# Patient Record
Sex: Female | Born: 1935 | Race: White | Hispanic: No
Health system: Southern US, Community
[De-identification: ages and names within clinical notes are randomized; demographics above are authoritative.]

---

## 1999-05-12 ENCOUNTER — Encounter: Payer: Self-pay | Admitting: Orthopedic Surgery

## 1999-05-15 ENCOUNTER — Inpatient Hospital Stay (HOSPITAL_COMMUNITY): Admission: RE | Admit: 1999-05-15 | Discharge: 1999-05-18 | Payer: Self-pay | Admitting: Orthopedic Surgery

## 1999-05-15 ENCOUNTER — Encounter: Payer: Self-pay | Admitting: Orthopedic Surgery

## 1999-05-18 ENCOUNTER — Inpatient Hospital Stay (HOSPITAL_COMMUNITY)
Admission: RE | Admit: 1999-05-18 | Discharge: 1999-05-26 | Payer: Self-pay | Admitting: Physical Medicine & Rehabilitation

## 1999-12-18 ENCOUNTER — Encounter: Admission: RE | Admit: 1999-12-18 | Discharge: 1999-12-18 | Payer: Self-pay | Admitting: Oncology

## 1999-12-18 ENCOUNTER — Encounter: Payer: Self-pay | Admitting: Oncology

## 2000-12-26 ENCOUNTER — Encounter: Admission: RE | Admit: 2000-12-26 | Discharge: 2000-12-26 | Payer: Self-pay | Admitting: Internal Medicine

## 2000-12-26 ENCOUNTER — Encounter: Payer: Self-pay | Admitting: Internal Medicine

## 2001-12-29 ENCOUNTER — Encounter: Admission: RE | Admit: 2001-12-29 | Discharge: 2001-12-29 | Payer: Self-pay | Admitting: Internal Medicine

## 2001-12-29 ENCOUNTER — Encounter: Payer: Self-pay | Admitting: Internal Medicine

## 2002-07-05 ENCOUNTER — Emergency Department (HOSPITAL_COMMUNITY): Admission: EM | Admit: 2002-07-05 | Discharge: 2002-07-05 | Payer: Self-pay | Admitting: Emergency Medicine

## 2002-07-05 ENCOUNTER — Encounter: Payer: Self-pay | Admitting: Emergency Medicine

## 2002-07-06 ENCOUNTER — Inpatient Hospital Stay (HOSPITAL_COMMUNITY): Admission: EM | Admit: 2002-07-06 | Discharge: 2002-07-09 | Payer: Self-pay | Admitting: Endocrinology

## 2002-07-06 ENCOUNTER — Encounter: Payer: Self-pay | Admitting: Endocrinology

## 2002-07-07 ENCOUNTER — Encounter (INDEPENDENT_AMBULATORY_CARE_PROVIDER_SITE_OTHER): Payer: Self-pay | Admitting: *Deleted

## 2002-07-12 ENCOUNTER — Emergency Department (HOSPITAL_COMMUNITY): Admission: EM | Admit: 2002-07-12 | Discharge: 2002-07-12 | Payer: Self-pay | Admitting: Emergency Medicine

## 2003-01-08 ENCOUNTER — Encounter: Payer: Self-pay | Admitting: Internal Medicine

## 2003-01-08 ENCOUNTER — Encounter: Admission: RE | Admit: 2003-01-08 | Discharge: 2003-01-08 | Payer: Self-pay | Admitting: Internal Medicine

## 2003-09-12 ENCOUNTER — Emergency Department (HOSPITAL_COMMUNITY): Admission: EM | Admit: 2003-09-12 | Discharge: 2003-09-12 | Payer: Self-pay | Admitting: Emergency Medicine

## 2003-11-14 ENCOUNTER — Ambulatory Visit (HOSPITAL_COMMUNITY): Admission: RE | Admit: 2003-11-14 | Discharge: 2003-11-14 | Payer: Self-pay | Admitting: Endocrinology

## 2004-02-11 ENCOUNTER — Ambulatory Visit: Payer: Self-pay | Admitting: Endocrinology

## 2004-02-18 ENCOUNTER — Ambulatory Visit (HOSPITAL_COMMUNITY): Admission: RE | Admit: 2004-02-18 | Discharge: 2004-02-18 | Payer: Self-pay | Admitting: Endocrinology

## 2004-02-21 ENCOUNTER — Ambulatory Visit: Payer: Self-pay | Admitting: Endocrinology

## 2004-04-16 ENCOUNTER — Ambulatory Visit: Payer: Self-pay | Admitting: Internal Medicine

## 2004-07-21 ENCOUNTER — Ambulatory Visit: Payer: Self-pay | Admitting: Internal Medicine

## 2004-07-27 ENCOUNTER — Ambulatory Visit: Payer: Self-pay | Admitting: Endocrinology

## 2004-08-05 ENCOUNTER — Ambulatory Visit: Payer: Self-pay

## 2004-08-21 ENCOUNTER — Ambulatory Visit: Payer: Self-pay | Admitting: Endocrinology

## 2004-09-18 ENCOUNTER — Ambulatory Visit: Payer: Self-pay | Admitting: Endocrinology

## 2004-10-14 ENCOUNTER — Ambulatory Visit: Payer: Self-pay | Admitting: Internal Medicine

## 2004-10-20 ENCOUNTER — Ambulatory Visit: Payer: Self-pay | Admitting: Endocrinology

## 2004-10-28 ENCOUNTER — Ambulatory Visit: Payer: Self-pay | Admitting: Cardiology

## 2004-11-06 ENCOUNTER — Ambulatory Visit: Payer: Self-pay | Admitting: Endocrinology

## 2004-11-11 ENCOUNTER — Ambulatory Visit (HOSPITAL_COMMUNITY): Admission: RE | Admit: 2004-11-11 | Discharge: 2004-11-11 | Payer: Self-pay | Admitting: Endocrinology

## 2005-02-04 ENCOUNTER — Ambulatory Visit: Payer: Self-pay | Admitting: Endocrinology

## 2005-04-07 ENCOUNTER — Ambulatory Visit (HOSPITAL_COMMUNITY): Admission: RE | Admit: 2005-04-07 | Discharge: 2005-04-07 | Payer: Self-pay | Admitting: Gastroenterology

## 2005-04-14 ENCOUNTER — Inpatient Hospital Stay (HOSPITAL_COMMUNITY): Admission: EM | Admit: 2005-04-14 | Discharge: 2005-04-16 | Payer: Self-pay | Admitting: Internal Medicine

## 2005-04-14 ENCOUNTER — Ambulatory Visit: Payer: Self-pay | Admitting: Internal Medicine

## 2005-04-15 ENCOUNTER — Encounter (INDEPENDENT_AMBULATORY_CARE_PROVIDER_SITE_OTHER): Payer: Self-pay | Admitting: Specialist

## 2005-04-16 ENCOUNTER — Ambulatory Visit: Payer: Self-pay | Admitting: Oncology

## 2005-04-20 ENCOUNTER — Ambulatory Visit: Payer: Self-pay | Admitting: Endocrinology

## 2005-06-10 ENCOUNTER — Ambulatory Visit: Payer: Self-pay | Admitting: Endocrinology

## 2005-06-15 ENCOUNTER — Encounter: Admission: RE | Admit: 2005-06-15 | Discharge: 2005-06-15 | Payer: Self-pay | Admitting: Gastroenterology

## 2005-10-11 ENCOUNTER — Ambulatory Visit: Payer: Self-pay | Admitting: Endocrinology

## 2005-10-27 ENCOUNTER — Ambulatory Visit: Payer: Self-pay

## 2005-11-04 ENCOUNTER — Ambulatory Visit: Payer: Self-pay | Admitting: Endocrinology

## 2005-11-11 ENCOUNTER — Ambulatory Visit: Payer: Self-pay | Admitting: Endocrinology

## 2006-05-03 ENCOUNTER — Ambulatory Visit: Payer: Self-pay | Admitting: Endocrinology

## 2006-06-01 ENCOUNTER — Ambulatory Visit: Payer: Self-pay | Admitting: Endocrinology

## 2006-06-27 ENCOUNTER — Ambulatory Visit: Payer: Self-pay | Admitting: Endocrinology

## 2006-08-01 ENCOUNTER — Ambulatory Visit: Payer: Self-pay | Admitting: Endocrinology

## 2006-08-01 LAB — CONVERTED CEMR LAB
ALT: 18 units/L (ref 0–40)
Amylase: 72 units/L (ref 27–131)
Bilirubin, Direct: 0.1 mg/dL (ref 0.0–0.3)
Calcium: 9.7 mg/dL (ref 8.4–10.5)
Eosinophils Absolute: 0 10*3/uL (ref 0.0–0.6)
Eosinophils Relative: 0.1 % (ref 0.0–5.0)
GFR calc Af Amer: 33 mL/min
GFR calc non Af Amer: 27 mL/min
Glucose, Bld: 97 mg/dL (ref 70–99)
Lymphocytes Relative: 18.7 % (ref 12.0–46.0)
MCV: 93 fL (ref 78.0–100.0)
Monocytes Relative: 11.1 % — ABNORMAL HIGH (ref 3.0–11.0)
Neutro Abs: 4.6 10*3/uL (ref 1.4–7.7)
Platelets: 364 10*3/uL (ref 150–400)
Potassium: 3.1 meq/L — ABNORMAL LOW (ref 3.5–5.1)
Sodium: 138 meq/L (ref 135–145)
WBC: 7 10*3/uL (ref 4.5–10.5)

## 2006-08-04 ENCOUNTER — Ambulatory Visit: Payer: Self-pay | Admitting: Endocrinology

## 2006-08-04 LAB — CONVERTED CEMR LAB
BUN: 42 mg/dL — ABNORMAL HIGH (ref 6–23)
Calcium: 8.6 mg/dL (ref 8.4–10.5)
Chloride: 103 meq/L (ref 96–112)
GFR calc non Af Amer: 42 mL/min

## 2006-09-28 ENCOUNTER — Ambulatory Visit: Payer: Self-pay | Admitting: Endocrinology

## 2006-09-28 LAB — CONVERTED CEMR LAB
ALT: 12 units/L (ref 0–40)
Basophils Relative: 1 % (ref 0.0–1.0)
Bilirubin, Direct: 0.1 mg/dL (ref 0.0–0.3)
CO2: 27 meq/L (ref 19–32)
Calcium, Total (PTH): 8.8 mg/dL (ref 8.4–10.5)
Creatinine, Ser: 1.2 mg/dL (ref 0.4–1.2)
Eosinophils Relative: 0.1 % (ref 0.0–5.0)
GFR calc Af Amer: 56 mL/min
Glucose, Bld: 97 mg/dL (ref 70–99)
HCT: 32.5 % — ABNORMAL LOW (ref 36.0–46.0)
Hemoglobin: 11 g/dL — ABNORMAL LOW (ref 12.0–15.0)
Lymphocytes Relative: 25.3 % (ref 12.0–46.0)
Monocytes Absolute: 0.5 10*3/uL (ref 0.2–0.7)
Neutro Abs: 2.9 10*3/uL (ref 1.4–7.7)
PTH: 84.8 pg/mL — ABNORMAL HIGH (ref 14.0–72.0)
Potassium: 4.4 meq/L (ref 3.5–5.1)
Total Bilirubin: 0.3 mg/dL (ref 0.3–1.2)
Total Protein: 6.5 g/dL (ref 6.0–8.3)
WBC: 4.5 10*3/uL (ref 4.5–10.5)

## 2006-10-13 ENCOUNTER — Encounter: Admission: RE | Admit: 2006-10-13 | Discharge: 2006-10-13 | Payer: Self-pay | Admitting: Endocrinology

## 2006-12-05 ENCOUNTER — Ambulatory Visit: Payer: Self-pay | Admitting: Endocrinology

## 2006-12-05 LAB — CONVERTED CEMR LAB
BUN: 24 mg/dL — ABNORMAL HIGH (ref 6–23)
Basophils Absolute: 0 10*3/uL (ref 0.0–0.1)
Basophils Relative: 0.3 % (ref 0.0–1.0)
CO2: 26 meq/L (ref 19–32)
Calcium: 9 mg/dL (ref 8.4–10.5)
GFR calc Af Amer: 51 mL/min
GFR calc non Af Amer: 42 mL/min
Iron: 35 ug/dL — ABNORMAL LOW (ref 42–145)
Lymphocytes Relative: 19.7 % (ref 12.0–46.0)
MCHC: 34.2 g/dL (ref 30.0–36.0)
Monocytes Absolute: 0.5 10*3/uL (ref 0.2–0.7)
Monocytes Relative: 9.8 % (ref 3.0–11.0)
Neutro Abs: 3.8 10*3/uL (ref 1.4–7.7)
Platelets: 177 10*3/uL (ref 150–400)
Potassium: 3.7 meq/L (ref 3.5–5.1)
Saturation Ratios: 11.4 % — ABNORMAL LOW (ref 20.0–50.0)
Transferrin: 219.4 mg/dL (ref >=212.0)

## 2006-12-14 ENCOUNTER — Ambulatory Visit: Payer: Self-pay | Admitting: Internal Medicine

## 2007-01-30 ENCOUNTER — Ambulatory Visit: Payer: Self-pay | Admitting: Endocrinology

## 2007-01-30 ENCOUNTER — Encounter: Payer: Self-pay | Admitting: Endocrinology

## 2007-01-30 DIAGNOSIS — I10 Essential (primary) hypertension: Secondary | ICD-10-CM | POA: Insufficient documentation

## 2007-01-30 DIAGNOSIS — K219 Gastro-esophageal reflux disease without esophagitis: Secondary | ICD-10-CM

## 2007-01-30 DIAGNOSIS — Z8679 Personal history of other diseases of the circulatory system: Secondary | ICD-10-CM | POA: Insufficient documentation

## 2007-01-30 DIAGNOSIS — Z85038 Personal history of other malignant neoplasm of large intestine: Secondary | ICD-10-CM | POA: Insufficient documentation

## 2007-01-30 DIAGNOSIS — M810 Age-related osteoporosis without current pathological fracture: Secondary | ICD-10-CM | POA: Insufficient documentation

## 2007-01-30 DIAGNOSIS — E785 Hyperlipidemia, unspecified: Secondary | ICD-10-CM | POA: Insufficient documentation

## 2007-01-30 DIAGNOSIS — H548 Legal blindness, as defined in USA: Secondary | ICD-10-CM

## 2007-05-05 ENCOUNTER — Telehealth (INDEPENDENT_AMBULATORY_CARE_PROVIDER_SITE_OTHER): Payer: Self-pay | Admitting: *Deleted

## 2007-05-15 ENCOUNTER — Ambulatory Visit: Payer: Self-pay | Admitting: Endocrinology

## 2007-05-15 DIAGNOSIS — M1990 Unspecified osteoarthritis, unspecified site: Secondary | ICD-10-CM | POA: Insufficient documentation

## 2007-05-15 DIAGNOSIS — D509 Iron deficiency anemia, unspecified: Secondary | ICD-10-CM

## 2007-05-16 LAB — CONVERTED CEMR LAB
BUN: 34 mg/dL — ABNORMAL HIGH (ref 6–23)
Basophils Relative: 1 % (ref 0.0–1.0)
CO2: 26 meq/L (ref 19–32)
Calcium: 9.3 mg/dL (ref 8.4–10.5)
Eosinophils Absolute: 0 10*3/uL (ref 0.0–0.6)
GFR calc Af Amer: 62 mL/min
GFR calc non Af Amer: 51 mL/min
Hemoglobin: 12.2 g/dL (ref 12.0–15.0)
Lymphocytes Relative: 22.7 % (ref 12.0–46.0)
MCHC: 33.3 g/dL (ref 30.0–36.0)
MCV: 97.7 fL (ref 78.0–100.0)
Monocytes Absolute: 0.5 10*3/uL (ref 0.2–0.7)
Monocytes Relative: 10.8 % (ref 3.0–11.0)
Neutro Abs: 2.9 10*3/uL (ref 1.4–7.7)
Platelets: 177 10*3/uL (ref 150–400)
Potassium: 4.1 meq/L (ref 3.5–5.1)
Saturation Ratios: 21.9 % (ref 20.0–50.0)
Transferrin: 205.9 mg/dL — ABNORMAL LOW (ref >=212.0)

## 2007-06-05 ENCOUNTER — Telehealth (INDEPENDENT_AMBULATORY_CARE_PROVIDER_SITE_OTHER): Payer: Self-pay | Admitting: *Deleted

## 2007-06-13 ENCOUNTER — Ambulatory Visit: Payer: Self-pay | Admitting: Endocrinology

## 2007-07-12 ENCOUNTER — Telehealth: Payer: Self-pay | Admitting: Endocrinology

## 2007-07-18 ENCOUNTER — Ambulatory Visit: Payer: Self-pay | Admitting: Endocrinology

## 2007-10-31 ENCOUNTER — Ambulatory Visit: Payer: Self-pay | Admitting: Endocrinology

## 2007-10-31 DIAGNOSIS — N2581 Secondary hyperparathyroidism of renal origin: Secondary | ICD-10-CM | POA: Insufficient documentation

## 2007-10-31 LAB — CONVERTED CEMR LAB
Alkaline Phosphatase: 51 units/L (ref 39–117)
Bilirubin, Direct: 0.1 mg/dL (ref 0.0–0.3)
CO2: 28 meq/L (ref 19–32)
Calcium, Total (PTH): 10.5 mg/dL (ref 8.4–10.5)
Calcium: 10.6 mg/dL — ABNORMAL HIGH (ref 8.4–10.5)
GFR calc Af Amer: 69 mL/min
HDL: 65.1 mg/dL (ref >39.0)
PTH: 12.3 pg/mL — ABNORMAL LOW (ref 14.0–72.0)
Potassium: 4.1 meq/L (ref 3.5–5.1)
Sodium: 134 meq/L — ABNORMAL LOW (ref 135–145)
Total Bilirubin: 0.7 mg/dL (ref 0.3–1.2)
Total CHOL/HDL Ratio: 1.8
VLDL: 12 mg/dL (ref 0–40)

## 2008-02-12 ENCOUNTER — Telehealth (INDEPENDENT_AMBULATORY_CARE_PROVIDER_SITE_OTHER): Payer: Self-pay | Admitting: *Deleted

## 2008-06-07 ENCOUNTER — Telehealth (INDEPENDENT_AMBULATORY_CARE_PROVIDER_SITE_OTHER): Payer: Self-pay | Admitting: *Deleted

## 2008-06-10 ENCOUNTER — Ambulatory Visit: Payer: Self-pay | Admitting: Endocrinology

## 2008-06-10 DIAGNOSIS — D61818 Other pancytopenia: Secondary | ICD-10-CM | POA: Insufficient documentation

## 2008-06-10 LAB — CONVERTED CEMR LAB
Calcium, Total (PTH): 9.2 mg/dL (ref 8.4–10.5)
PTH: 102.6 pg/mL — ABNORMAL HIGH (ref 14.0–72.0)

## 2008-06-12 LAB — CONVERTED CEMR LAB
Basophils Relative: 0 % (ref 0.0–3.0)
Eosinophils Relative: 0.2 % (ref 0.0–5.0)
HCT: 35.3 % — ABNORMAL LOW (ref 36.0–46.0)
Hemoglobin: 12.2 g/dL (ref 12.0–15.0)
Lymphocytes Relative: 23.1 % (ref 12.0–46.0)
Monocytes Relative: 11.1 % (ref 3.0–12.0)
Neutro Abs: 2.9 10*3/uL (ref 1.4–7.7)
RBC: 3.6 M/uL — ABNORMAL LOW (ref 3.87–5.11)

## 2008-06-17 ENCOUNTER — Ambulatory Visit: Payer: Self-pay | Admitting: Endocrinology

## 2008-06-18 ENCOUNTER — Ambulatory Visit: Payer: Self-pay | Admitting: Internal Medicine

## 2008-06-18 ENCOUNTER — Telehealth: Payer: Self-pay | Admitting: Family Medicine

## 2008-06-18 ENCOUNTER — Inpatient Hospital Stay (HOSPITAL_COMMUNITY): Admission: EM | Admit: 2008-06-18 | Discharge: 2008-06-24 | Payer: Self-pay | Admitting: Emergency Medicine

## 2008-06-18 ENCOUNTER — Ambulatory Visit: Payer: Self-pay | Admitting: Oncology

## 2008-06-19 ENCOUNTER — Encounter: Payer: Self-pay | Admitting: Oncology

## 2008-06-19 LAB — CONVERTED CEMR LAB
Eosinophils Relative: 0.3 % (ref 0.0–5.0)
Hemoglobin: 12.2 g/dL (ref 12.0–15.0)
Lymphocytes Relative: 21.3 % (ref 12.0–46.0)
Monocytes Relative: 11.2 % (ref 3.0–12.0)
Neutro Abs: 3 10*3/uL (ref 1.4–7.7)
RBC: 3.62 M/uL — ABNORMAL LOW (ref 3.87–5.11)
RDW: 12.9 % (ref 11.5–14.6)
WBC: 4.5 10*3/uL (ref 4.5–10.5)

## 2008-06-21 ENCOUNTER — Ambulatory Visit: Payer: Self-pay | Admitting: Oncology

## 2008-07-02 ENCOUNTER — Ambulatory Visit (HOSPITAL_COMMUNITY): Admission: RE | Admit: 2008-07-02 | Discharge: 2008-07-02 | Payer: Self-pay | Admitting: Oncology

## 2008-07-02 ENCOUNTER — Encounter: Payer: Self-pay | Admitting: Endocrinology

## 2008-07-02 LAB — CBC WITH DIFFERENTIAL/PLATELET
Basophils Absolute: 0 10*3/uL (ref 0.0–0.1)
EOS%: 0.2 % (ref 0.0–7.0)
HGB: 12.9 g/dL (ref 11.6–15.9)
MCH: 33.3 pg (ref 25.1–34.0)
MCHC: 34.5 g/dL (ref 31.5–36.0)
MCV: 96.5 fL (ref 79.5–101.0)
MONO%: 8.4 % (ref 0.0–14.0)
NEUT%: 78 % — ABNORMAL HIGH (ref 38.4–76.8)
RDW: 13 % (ref 11.2–14.5)

## 2008-07-02 LAB — COMPREHENSIVE METABOLIC PANEL
AST: 19 U/L (ref 0–37)
Alkaline Phosphatase: 58 U/L (ref 39–117)
BUN: 30 mg/dL — ABNORMAL HIGH (ref 6–23)
Creatinine, Ser: 1.17 mg/dL (ref 0.40–1.20)

## 2008-08-02 ENCOUNTER — Ambulatory Visit: Payer: Self-pay | Admitting: Oncology

## 2008-08-06 ENCOUNTER — Encounter: Payer: Self-pay | Admitting: Endocrinology

## 2008-08-06 ENCOUNTER — Encounter: Payer: Self-pay | Admitting: Internal Medicine

## 2008-08-06 LAB — CBC WITH DIFFERENTIAL/PLATELET
BASO%: 0 % (ref 0.0–2.0)
Basophils Absolute: 0 10*3/uL (ref 0.0–0.1)
HCT: 40 % (ref 34.8–46.6)
HGB: 14.1 g/dL (ref 11.6–15.9)
LYMPH%: 8.3 % — ABNORMAL LOW (ref 14.0–49.7)
MCH: 33.1 pg (ref 25.1–34.0)
MCHC: 35.3 g/dL (ref 31.5–36.0)
MONO#: 0.2 10*3/uL (ref 0.1–0.9)
NEUT%: 89.4 % — ABNORMAL HIGH (ref 38.4–76.8)
Platelets: 222 10*3/uL (ref 145–400)
WBC: 9.6 10*3/uL (ref 3.9–10.3)
lymph#: 0.8 10*3/uL — ABNORMAL LOW (ref 0.9–3.3)

## 2008-08-08 ENCOUNTER — Ambulatory Visit (HOSPITAL_COMMUNITY): Admission: RE | Admit: 2008-08-08 | Discharge: 2008-08-08 | Payer: Self-pay | Admitting: Oncology

## 2008-08-26 ENCOUNTER — Encounter: Payer: Self-pay | Admitting: Endocrinology

## 2008-09-10 ENCOUNTER — Encounter: Payer: Self-pay | Admitting: Endocrinology

## 2008-09-10 LAB — CBC WITH DIFFERENTIAL/PLATELET
BASO%: 0 % (ref 0.0–2.0)
Eosinophils Absolute: 0 10*3/uL (ref 0.0–0.5)
HCT: 35.1 % (ref 34.8–46.6)
LYMPH%: 4.3 % — ABNORMAL LOW (ref 14.0–49.7)
MONO#: 0.5 10*3/uL (ref 0.1–0.9)
NEUT#: 11.6 10*3/uL — ABNORMAL HIGH (ref 1.5–6.5)
NEUT%: 92 % — ABNORMAL HIGH (ref 38.4–76.8)
Platelets: 206 10*3/uL (ref 145–400)
WBC: 12.6 10*3/uL — ABNORMAL HIGH (ref 3.9–10.3)
lymph#: 0.5 10*3/uL — ABNORMAL LOW (ref 0.9–3.3)

## 2008-10-10 ENCOUNTER — Emergency Department (HOSPITAL_COMMUNITY): Admission: EM | Admit: 2008-10-10 | Discharge: 2008-10-10 | Payer: Self-pay | Admitting: Emergency Medicine

## 2008-10-12 ENCOUNTER — Encounter: Admission: RE | Admit: 2008-10-12 | Discharge: 2008-10-12 | Payer: Self-pay | Admitting: Geriatric Medicine

## 2008-12-08 ENCOUNTER — Ambulatory Visit: Payer: Self-pay | Admitting: Internal Medicine

## 2008-12-08 ENCOUNTER — Inpatient Hospital Stay (HOSPITAL_COMMUNITY): Admission: EM | Admit: 2008-12-08 | Discharge: 2008-12-15 | Payer: Self-pay | Admitting: Emergency Medicine

## 2008-12-26 ENCOUNTER — Ambulatory Visit: Payer: Self-pay | Admitting: Endocrinology

## 2008-12-26 DIAGNOSIS — R1319 Other dysphagia: Secondary | ICD-10-CM

## 2008-12-26 DIAGNOSIS — L89109 Pressure ulcer of unspecified part of back, unspecified stage: Secondary | ICD-10-CM | POA: Insufficient documentation

## 2009-01-08 ENCOUNTER — Telehealth: Payer: Self-pay | Admitting: Endocrinology

## 2009-01-16 ENCOUNTER — Telehealth: Payer: Self-pay | Admitting: Endocrinology

## 2009-01-21 ENCOUNTER — Telehealth: Payer: Self-pay | Admitting: Endocrinology

## 2009-01-24 ENCOUNTER — Telehealth: Payer: Self-pay | Admitting: Endocrinology

## 2009-01-27 ENCOUNTER — Encounter: Payer: Self-pay | Admitting: Endocrinology

## 2009-02-03 ENCOUNTER — Telehealth (INDEPENDENT_AMBULATORY_CARE_PROVIDER_SITE_OTHER): Payer: Self-pay | Admitting: *Deleted

## 2009-02-04 ENCOUNTER — Telehealth: Payer: Self-pay | Admitting: Endocrinology

## 2009-02-10 ENCOUNTER — Telehealth (INDEPENDENT_AMBULATORY_CARE_PROVIDER_SITE_OTHER): Payer: Self-pay | Admitting: *Deleted

## 2009-02-10 ENCOUNTER — Telehealth: Payer: Self-pay | Admitting: Endocrinology

## 2009-02-12 ENCOUNTER — Telehealth: Payer: Self-pay | Admitting: Endocrinology

## 2009-02-12 ENCOUNTER — Ambulatory Visit: Payer: Self-pay | Admitting: Endocrinology

## 2009-02-13 ENCOUNTER — Ambulatory Visit: Payer: Self-pay | Admitting: Endocrinology

## 2009-02-13 ENCOUNTER — Telehealth (INDEPENDENT_AMBULATORY_CARE_PROVIDER_SITE_OTHER): Payer: Self-pay | Admitting: *Deleted

## 2009-02-13 DIAGNOSIS — E039 Hypothyroidism, unspecified: Secondary | ICD-10-CM | POA: Insufficient documentation

## 2009-02-13 DIAGNOSIS — F0390 Unspecified dementia without behavioral disturbance: Secondary | ICD-10-CM

## 2009-02-13 DIAGNOSIS — E876 Hypokalemia: Secondary | ICD-10-CM

## 2009-02-13 DIAGNOSIS — K439 Ventral hernia without obstruction or gangrene: Secondary | ICD-10-CM | POA: Insufficient documentation

## 2009-02-13 DIAGNOSIS — N390 Urinary tract infection, site not specified: Secondary | ICD-10-CM

## 2009-02-13 LAB — CONVERTED CEMR LAB
Basophils Absolute: 0 10*3/uL (ref 0.0–0.1)
Bilirubin Urine: NEGATIVE
Calcium: 9.8 mg/dL (ref 8.4–10.5)
Chloride: 98 meq/L (ref 96–112)
Creatinine, Ser: 0.9 mg/dL (ref 0.4–1.2)
Eosinophils Absolute: 0 10*3/uL (ref 0.0–0.7)
Iron: 42 ug/dL (ref 42–145)
Lymphs Abs: 2.9 10*3/uL (ref 0.7–4.0)
MCHC: 34.4 g/dL (ref 30.0–36.0)
Monocytes Absolute: 0.7 10*3/uL (ref 0.1–1.0)
Monocytes Relative: 7.8 % (ref 3.0–12.0)
Neutro Abs: 5.7 10*3/uL (ref 1.4–7.7)
Nitrite: NEGATIVE
Platelets: 264 10*3/uL (ref 150.0–400.0)
RBC: 4.43 M/uL (ref 3.87–5.11)
RDW: 15.1 % — ABNORMAL HIGH (ref 11.5–14.6)
TSH: 7.97 microintl units/mL — ABNORMAL HIGH (ref 0.35–5.50)
Total Protein, Urine: 30 mg/dL
WBC: 9.3 10*3/uL (ref 4.5–10.5)
pH: 5.5 (ref 5.0–8.0)

## 2009-02-17 ENCOUNTER — Telehealth: Payer: Self-pay | Admitting: Endocrinology

## 2009-02-17 ENCOUNTER — Telehealth (INDEPENDENT_AMBULATORY_CARE_PROVIDER_SITE_OTHER): Payer: Self-pay | Admitting: *Deleted

## 2009-02-28 ENCOUNTER — Ambulatory Visit: Payer: Self-pay | Admitting: Endocrinology

## 2009-03-03 ENCOUNTER — Telehealth (INDEPENDENT_AMBULATORY_CARE_PROVIDER_SITE_OTHER): Payer: Self-pay | Admitting: *Deleted

## 2009-03-17 ENCOUNTER — Telehealth (INDEPENDENT_AMBULATORY_CARE_PROVIDER_SITE_OTHER): Payer: Self-pay | Admitting: *Deleted

## 2009-03-19 ENCOUNTER — Encounter: Payer: Self-pay | Admitting: Endocrinology

## 2009-04-09 ENCOUNTER — Telehealth (INDEPENDENT_AMBULATORY_CARE_PROVIDER_SITE_OTHER): Payer: Self-pay | Admitting: *Deleted

## 2009-04-09 ENCOUNTER — Ambulatory Visit: Payer: Self-pay | Admitting: Endocrinology

## 2009-04-14 ENCOUNTER — Telehealth: Payer: Self-pay | Admitting: Endocrinology

## 2009-04-16 ENCOUNTER — Telehealth: Payer: Self-pay | Admitting: Endocrinology

## 2009-04-29 ENCOUNTER — Telehealth: Payer: Self-pay | Admitting: Internal Medicine

## 2009-05-06 ENCOUNTER — Telehealth: Payer: Self-pay | Admitting: Endocrinology

## 2009-05-10 ENCOUNTER — Inpatient Hospital Stay (HOSPITAL_COMMUNITY): Admission: EM | Admit: 2009-05-10 | Discharge: 2009-05-12 | Payer: Self-pay | Admitting: Emergency Medicine

## 2009-05-15 ENCOUNTER — Telehealth: Payer: Self-pay | Admitting: Endocrinology

## 2009-05-15 ENCOUNTER — Encounter: Payer: Self-pay | Admitting: Endocrinology

## 2009-05-19 ENCOUNTER — Encounter: Payer: Self-pay | Admitting: Endocrinology

## 2009-05-21 ENCOUNTER — Telehealth: Payer: Self-pay | Admitting: Endocrinology

## 2009-05-21 ENCOUNTER — Telehealth (INDEPENDENT_AMBULATORY_CARE_PROVIDER_SITE_OTHER): Payer: Self-pay | Admitting: *Deleted

## 2009-05-25 ENCOUNTER — Encounter: Payer: Self-pay | Admitting: Endocrinology

## 2009-05-26 ENCOUNTER — Telehealth: Payer: Self-pay | Admitting: Endocrinology

## 2009-06-09 ENCOUNTER — Ambulatory Visit: Payer: Self-pay | Admitting: Endocrinology

## 2009-06-20 ENCOUNTER — Telehealth (INDEPENDENT_AMBULATORY_CARE_PROVIDER_SITE_OTHER): Payer: Self-pay | Admitting: *Deleted

## 2009-07-29 ENCOUNTER — Encounter: Payer: Self-pay | Admitting: Internal Medicine

## 2009-07-30 ENCOUNTER — Telehealth: Payer: Self-pay | Admitting: Internal Medicine

## 2009-07-30 ENCOUNTER — Encounter: Payer: Self-pay | Admitting: Internal Medicine

## 2009-08-04 ENCOUNTER — Ambulatory Visit: Payer: Self-pay | Admitting: Endocrinology

## 2009-08-06 ENCOUNTER — Telehealth: Payer: Self-pay | Admitting: Endocrinology

## 2009-08-06 ENCOUNTER — Encounter: Payer: Self-pay | Admitting: Internal Medicine

## 2009-08-10 ENCOUNTER — Encounter: Payer: Self-pay | Admitting: Endocrinology

## 2009-08-25 ENCOUNTER — Telehealth (INDEPENDENT_AMBULATORY_CARE_PROVIDER_SITE_OTHER): Payer: Self-pay | Admitting: *Deleted

## 2009-09-10 DEATH — deceased

## 2010-05-12 NOTE — Letter (Signed)
Summary: CMN/HCS  CMN/HCS   Imported By: Lester Boise 08/13/2009 09:16:11  _____________________________________________________________________  External Attachment:    Type:   Image     Comment:   External Document

## 2010-05-12 NOTE — Miscellaneous (Signed)
Summary: Care Plan/[facility]  Care Plan/[facility]   Imported By: Lester LaPorte 04/24/2009 10:39:08  _____________________________________________________________________  External Attachment:    Type:   Image     Comment:   External Document

## 2010-05-12 NOTE — Progress Notes (Signed)
Summary: ABX  Phone Note Call from Patient   Caller: Daughter [name] [phone] Summary of Call: pt's daughter called stating that pt was on multiple ABX at the time of reaction--lethargy, delusions and confusion. Daughter is not certain that this ABX was the cause but states that she will be more confortable with pt taking a different ABX Daughter says that she will check PT again and I verified MRN number with her. Initial call taken by: Margaret Pyle, CMA,  May 26, 2009 3:58 PM  Follow-up for Phone Call        thanks, we have added cipro to the allergy list. Follow-up by: Minus Breeding MD,  May 26, 2009 4:12 PM   New Allergies: ! CIPROFLOXACIN HCL (CIPROFLOXACIN HCL) New Allergies: ! CIPROFLOXACIN HCL (CIPROFLOXACIN HCL)

## 2010-05-12 NOTE — Progress Notes (Signed)
  Phone Note Call from Patient   Summary of Call: Spoke with pts daughter Cordelia Pen @ [phone]) she stated MD wanted her to return in 2-3 weeks and she said this is not possible. She said the only way to transport her mom is by ambulance. She wanted me to pass this message for an FYI. Initial call taken by: Josph Macho CMA,  May 21, 2009 9:20 AM  Follow-up for Phone Call        noted, thank you.  i am unable to find a dr to come to your home periodically, so plan to come here, by ambulance if necessary, at least 2x a year Follow-up by: Minus Breeding MD,  May 24, 2009 3:45 PM  Additional Follow-up for Phone Call Additional follow up Details #1::        Patient daughter notified and is very upset that no one notified her that her mother still had an infection and that the antibiotic sent in is one she is allergic to. Please advise of new antibiotic.  *Updated allergy list. Additional Follow-up by: Lucious Groves,  May 26, 2009 9:14 AM   New Allergies: ! CIPROFLOXACIN HCL (CIPROFLOXACIN HCL) Additional Follow-up for Phone Call Additional follow up Details #2::    a urine test was taken on 05/19/09, and hh nurse should have notified you of that.  assuming that was done, a message was left for you on phone tree.  cipro was not listed as an allergy either here in the office, or at the hospital.  in the hospital, pt was given a drug very similar to cipro, and no mention of any probs with this drug is on the computer hospital summary.  please tell us when and what was the "allergy" to cipro. Follow-up by: Minus Breeding MD,  May 26, 2009 12:22 PM  Additional Follow-up for Phone Call Additional follow up Details #3:: Details for Additional Follow-up Action Taken: left message on machine for pt's daughter to return my call. Margaret Pyle, CMA  May 26, 2009 1:38 PM   pt's daughter stated that allergy is listed on pt's discharge order. I did not see allergy on D/C in  EMR but daughter is sure. [name] is requesting an alternate ABX and she is also still very upset about not being notified about UTI. She says she did check PT and there was no message there. please advise. Margaret Pyle, CMA  May 26, 2009 2:45 PM   New Allergies: ! CIPROFLOXACIN HCL (CIPROFLOXACIN HCL)        Allergies: 1)  ! Norvasc (Amlodipine Besylate) 2)  ! Bactrim 3)  ! Ciprofloxacin Hcl (Ciprofloxacin Hcl) 4)  Amoxicillin (Amoxicillin)

## 2010-05-12 NOTE — Progress Notes (Signed)
Summary: REFILLS  Phone Note Call from Patient Call back at Home Phone [phone]   Caller:  [phone] [name] Summary of Call: Patient is requesting a call back regarding mail order meds.  Initial call taken by: Lamar Sprinkles, CMA,  April 14, 2009 5:46 PM  Follow-up for Phone Call        1. pt's daughter is requesting refill of all of pt medication to send to mail order co (she will pick up Rx) 2. [name] is requesting MD review pt dosage of Avalide. pt's family friend is a MD and told pt daughter that she should be concerned because this is a very high dose. Pt has been on 300-12.5 two times a day since leaving Nursing home in march ( this not on med list). pt's daughter says she brought pt's meds in last OV and this should have been updated on med list 3. pt has cough and would like to know if there is any OTC med MD can recommend. Please advise..... Follow-up by: Margaret Pyle, CMA,  April 15, 2009 9:18 AM  Additional Follow-up for Phone Call Additional follow up Details #1::        our med list says avalide 300/25, 1/day.  please take this only.  i have printed refills. if pt is verified to have no fever or sob, she can take robitussin-dm as needed cough. Additional Follow-up by: Minus Breeding MD,  April 15, 2009 9:26 AM    Additional Follow-up for Phone Call Additional follow up Details #2::    pt's daughter informed, rx in cabinet for pt pick up Follow-up by: Margaret Pyle, CMA,  April 15, 2009 9:41 AM  Prescriptions: MIRALAX  POWD (POLYETHYLENE GLYCOL 3350) 1/2 of 17 grams qd  #3 mos x 3   Entered and Authorized by:   Minus Breeding MD   Signed by:   Minus Breeding MD on 04/15/2009   Method used:   Print then Give to Patient   RxID:   [id number] PANTOPRAZOLE SODIUM 40 MG TBEC (PANTOPRAZOLE SODIUM)   #90 x 3   Entered and Authorized by:   Minus Breeding MD   Signed by:   Minus Breeding MD on 04/15/2009   Method used:    Print then Give to Patient   RxID:   [id number] POTASSIUM CHLORIDE 20 MEQ/15ML (10%) LIQD (POTASSIUM CHLORIDE) 10 meq qd  #90 x 3   Entered and Authorized by:   Minus Breeding MD   Signed by:   Minus Breeding MD on 04/15/2009   Method used:   Print then Give to Patient   RxID:   [id number] CITALOPRAM HYDROBROMIDE 10 MG TABS (CITALOPRAM HYDROBROMIDE) 1 qd  #90 x 3   Entered and Authorized by:   Minus Breeding MD   Signed by:   Minus Breeding MD on 04/15/2009   Method used:   Print then Give to Patient   RxID:   [id number] LEVOTHYROXINE SODIUM 25 MCG TABS (LEVOTHYROXINE SODIUM) 1 tab qd  #90 x 3   Entered and Authorized by:   Minus Breeding MD   Signed by:   Minus Breeding MD on 04/15/2009   Method used:   Print then Give to Patient   RxID:   [id number] FERREX 150 150 MG CAPS (POLYSACCHARIDE IRON COMPLEX) take 2 by mouth qd  #180 x 3   Entered and Authorized by:   Minus Breeding  MD   Signed by:   Minus Breeding MD on 04/15/2009   Method used:   Print then Give to Patient   RxID:   [id number] AVALIDE 300-25 MG TABS (IRBESARTAN-HYDROCHLOROTHIAZIDE) take 1 by mouth qd  #90 x 3   Entered and Authorized by:   Minus Breeding MD   Signed by:   Minus Breeding MD on 04/15/2009   Method used:   Print then Give to Patient   RxID:   [id number]

## 2010-05-12 NOTE — Progress Notes (Signed)
  Phone Note Outgoing Call   Summary of Call: spoke with pts daughter Cordelia Pen) at [phone] and she informed me pt is now with Hospice. Initial call taken by: Josph Macho RMA,  Aug 25, 2009 9:59 AM

## 2010-05-12 NOTE — Progress Notes (Signed)
  Phone Note Outgoing Call   Summary of Call: Left a message for Koleen Distance at [facility] [phone]) to contact our office concerning pt. Initial call taken by: Josph Macho CMA,  May 21, 2009 9:51 AM  Follow-up for Phone Call        Spoke with Kriste Basque at [facility] and faxed orders to [facility] at [phone]. Also sent a copy to be scanned. Follow-up by: Josph Macho CMA,  May 21, 2009 10:56 AM     Appended Document:  Mailed out completed paperwork. Sent a copy to be scanned.

## 2010-05-12 NOTE — Progress Notes (Signed)
Summary: RX needed  Phone Note Call from Patient   Caller: Daughter--Sherri Tiburcio Pea Summary of Call: Patient daughter left message on triage that patient did not get all of her prescriptions (90 day supply for [facility]). Per the message the daughter did not get Vitamin D and her pain medicine. I will inform the daughter that pain meds cannot be done in 90 day supply, but can we print prescription for the vitamin d? Please advise. Initial call taken by: Lucious Groves,  April 16, 2009 8:09 AM  Follow-up for Phone Call        for pt's safety, i need to see the prescription bottle for the vitamin-d to verify dosage.   the computer says the pain med was done sept 2010 for #100, x 6, so that would make it due in 2 more mos.  how many does pt take per day? Follow-up by: Minus Breeding MD,  April 16, 2009 8:25 AM  Additional Follow-up for Phone Call Additional follow up Details #1::        Returned call and was told to call Sherri at work # [phone]. Patient daughter notified, and she said the patient is only taking 2-3 pain meds per day. She will call back with dosage of the vitamin D. Additional Follow-up by: Lucious Groves,  April 16, 2009 9:29 AM    Additional Follow-up for Phone Call Additional follow up Details #2::    pr's daughter called back stating pt is taking Vit D2 1.25 micrograms 50,000 units Follow-up by: Margaret Pyle, CMA,  April 16, 2009 2:02 PM  Additional Follow-up for Phone Call Additional follow up Details #3:: Details for Additional Follow-up Action Taken: i sent vitamin-d to [facility]   at this many pain pills/day, the prescription will be due for refill in march.  please return here by then.  pt's daughter informed. Margaret Pyle, CMA  April 16, 2009 4:07 PM  Additional Follow-up by: Minus Breeding MD,  April 16, 2009 3:31 PM  New/Updated Medications: VITAMIN D (ERGOCALCIFEROL) 50000 UNIT CAPS (ERGOCALCIFEROL) 1q week Prescriptions: VITAMIN D  (ERGOCALCIFEROL) 50000 UNIT CAPS (ERGOCALCIFEROL) 1q week  #12 x 1   Entered and Authorized by:   Minus Breeding MD   Signed by:   Minus Breeding MD on 04/16/2009   Method used:   Electronically to        [facility]* (mail-order)             ,          Ph: [phone]       Fax: [phone]   RxID:   [id number]

## 2010-05-12 NOTE — Progress Notes (Signed)
Summary: rx refill       New/Updated Medications: PANTOPRAZOLE SODIUM 40 MG TBEC (PANTOPRAZOLE SODIUM) 1 by mouth once daily Prescriptions: PANTOPRAZOLE SODIUM 40 MG TBEC (PANTOPRAZOLE SODIUM) 1 by mouth once daily  #90 x 3   Entered by:   Margaret Pyle, CMA   Authorized by:   Corwin Levins MD   Signed by:   Margaret Pyle, CMA on 05/06/2009   Method used:   Faxed to ...       [facility]* (mail-order)             ,          Ph: [phone]       Fax: [phone]   RxID:   [id number]

## 2010-05-12 NOTE — Miscellaneous (Signed)
Summary: [facility]   Imported By: Lester Mangham 08/08/2009 10:40:30  _____________________________________________________________________  External Attachment:    Type:   Image     Comment:   External Document

## 2010-05-12 NOTE — Miscellaneous (Signed)
Summary: Plan/[facility]  Plan/[facility]   Imported By: Lester  06/11/2009 10:20:16  _____________________________________________________________________  External Attachment:    Type:   Image     Comment:   External Document

## 2010-05-12 NOTE — Progress Notes (Signed)
Summary: Avalide-Changed to Losartan  Phone Note From Pharmacy   Caller: [employer] [place] [phone] Call For: ID:  [id number]  Summary of Call: PA request--Avalide. The office rec'd letter from [facility] that patient was given a temporary supply of Avalide. The preferred alternatives are:  Diovan, Micardis, Losartan, and HCTZ. (HCT combo is also included) Please advise. Initial call taken by: Lucious Groves,  May 15, 2009 8:31 AM  Follow-up for Phone Call        med list already reflects change to losartan-hctz.  ok to refill it as needed. Follow-up by: Minus Breeding MD,  May 15, 2009 8:47 AM  Additional Follow-up for Phone Call Additional follow up Details #1::        noted. Thanks. Additional Follow-up by: Lucious Groves,  May 15, 2009 10:24 AM

## 2010-05-12 NOTE — Miscellaneous (Signed)
Summary: [facility]  [facility]   Imported By: Lester Stockton 08/06/2009 09:08:25  _____________________________________________________________________  External Attachment:    Type:   Image     Comment:   External Document

## 2010-05-12 NOTE — Progress Notes (Signed)
Summary: verbal/SAE pt  Phone Note Other Incoming   Caller: [facility] [phone] Summary of Call: HHRN called stating that pt's daughter reports mild change in pt's mental status. RN is request a as needed visit with pt check for UTI. verbal okay? Initial call taken by: Margaret Pyle, CMA,  July 30, 2009 1:39 PM  Follow-up for Phone Call        ok for verbal Follow-up by: Corwin Levins MD,  July 30, 2009 1:43 PM  Additional Follow-up for Phone Call Additional follow up Details #1::        [facility] informed Additional Follow-up by: Margaret Pyle, CMA,  July 30, 2009 1:53 PM

## 2010-05-12 NOTE — Progress Notes (Signed)
Summary: Hospice services  Phone Note Other Incoming   Caller: Vicki @ [facility] [place] [phone] Summary of Call: Chip Boer called stating that pt's daughter is requesting Hospice services and last OV note needs to be sent. I have faxed OV to Vicki's attention [phone] Initial call taken by: Margaret Pyle, CMA,  August 06, 2009 9:12 AM

## 2010-05-12 NOTE — Miscellaneous (Signed)
Summary: Plan of Care & Treatment/[facility] Care & [facility]   Imported By: Sherian Rein 08/06/2009 12:06:39  _____________________________________________________________________  External Attachment:    Type:   Image     Comment:   External Document

## 2010-05-12 NOTE — Progress Notes (Signed)
  Phone Note From Other Clinic   Summary of Call: Koleen Distance from [place] states pt daughter Cordelia Pen [phone]) called her and said the pt is coughing (nothing coming up), temp 97.3, occasionally weezing, no other symptoms. Daughter asked nurse at [facility] if she should give her mother a decongestant? Pt is bed ridden and daughter has to call ambulance to get pt to appts? Please advise? Initial call taken by: Josph Macho RMA,  June 20, 2009 3:36 PM  Follow-up for Phone Call        please advise loratadine-d as needed congestion Follow-up by: Minus Breeding MD,  June 20, 2009 3:52 PM  Additional Follow-up for Phone Call Additional follow up Details #1::        Informed pts daughter Cordelia Pen) Additional Follow-up by: Josph Macho RMA,  June 20, 2009 4:23 PM

## 2010-05-12 NOTE — Miscellaneous (Signed)
Summary: Physician Interim Order/[facility]  Physician Interim Order/[facility]   Imported By: Sherian Rein 05/28/2009 11:41:28  _____________________________________________________________________  External Attachment:    Type:   Image     Comment:   External Document

## 2010-05-12 NOTE — Progress Notes (Signed)
Summary: Alt Med/ SAE pt  Phone Note From Pharmacy   Caller: [facility] [phone] ref [id number] Summary of Call: [facility] called to inform MD that Avalide is unavailable through manufacturer. [facility] is unsure when it willbe available and is requesting an alternate. [facility] has suggested Hyzaar 50/12.5, 100/12.5 or 100/25. please advise, I will fax back to [facility] to include ref ID. Initial call taken by: Margaret Pyle, CMA,  April 29, 2009 11:24 AM  Follow-up for Phone Call        ok for the losartan/hctz 100/25  - but call pt first and ask if she wants the rx sent to [facility] now, or she can come in for it  done hardcopy to [facility]  Follow-up by: Corwin Levins MD,  April 29, 2009 12:06 PM  Additional Follow-up for Phone Call Additional follow up Details #1::        Rx sent to [facility]. pt's daughter informed Additional Follow-up by: Margaret Pyle, CMA,  April 29, 2009 1:18 PM    New/Updated Medications: LOSARTAN POTASSIUM-HCTZ 100-25 MG TABS (LOSARTAN POTASSIUM-HCTZ) 1 by mouth once daily Prescriptions: LOSARTAN POTASSIUM-HCTZ 100-25 MG TABS (LOSARTAN POTASSIUM-HCTZ) 1 by mouth once daily  #90 x 3   Entered and Authorized by:   Corwin Levins MD   Signed by:   Corwin Levins MD on 04/29/2009   Method used:   Print then Give to Patient   RxID:   [id number]

## 2010-05-12 NOTE — Progress Notes (Signed)
Summary: Cipro  Phone Note Call from Patient Call back at Home Phone [phone]   Caller: Patient [phone] Summary of Call: pt's daughter called stating that pt needs Rx for Promethizine, Rx was removed from med list 02/2009 Initial call taken by: Margaret Pyle, CMA,  May 26, 2009 4:56 PM  Follow-up for Phone Call        does dtr agree to bring pt in at least 2x a year for doctor appointments? Follow-up by: Minus Breeding MD,  May 27, 2009 8:42 AM  Additional Follow-up for Phone Call Additional follow up Details #1::        pt's daughter states that she will do her best to get pt in 2x a year. pt is also asking for an alt to Cipro since MD added it to allergy list. please advise Additional Follow-up by: Margaret Pyle, CMA,  May 27, 2009 9:07 AM    Additional Follow-up for Phone Call Additional follow up Details #2::    this infection is very resisitant to antibiotics.  unless pt has fever, she should not take antibiotics. Follow-up by: Minus Breeding MD,  May 27, 2009 12:30 PM  Additional Follow-up for Phone Call Additional follow up Details #3:: Details for Additional Follow-up Action Taken: pt's daughter wants to make sure MD is aware that pt has a cath. She also questioned the reason MD rx ABX originally? she is also requesting a refill of promethazine. please advise. Margaret Pyle, CMA  May 27, 2009 2:49 PM    New/Updated Medications: PROMETHAZINE HCL 12.5 MG TABS (PROMETHAZINE HCL) 1 q4h as needed nausea Prescriptions: PROMETHAZINE HCL 12.5 MG TABS (PROMETHAZINE HCL) 1 q4h as needed nausea  #50 x 3   Entered and Authorized by:   Minus Breeding MD   Signed by:   Minus Breeding MD on 05/27/2009   Method used:   Electronically to        [facility]  [address] [id number]* (retail)       [address]       [place], [place]  [address]       Ph: [phone] or [phone]       Fax: [phone]   RxID:    [id number]  at the time of the rx, the culture result was not available.  yes, i am aware she has a catheter. [name], md  I will inform daughter, will pt be able to get refill of promethazine? Margaret Pyle, CMA  May 27, 2009 3:14 PM   sent [name], md  pt's daughter informed. Margaret Pyle, CMA  May 28, 2009 8:47 AM

## 2010-05-12 NOTE — Medication Information (Signed)
Summary: Losartan/[employer] [facility] [address]  Losartan/[employer] [place] [facility] [place]   Imported By: Lester [place] 05/16/2009 07:59:59  _____________________________________________________________________  External Attachment:    Type:   Image     Comment:   External Document

## 2010-06-28 LAB — CBC
HCT: 32.4 % — ABNORMAL LOW (ref 36.0–46.0)
HCT: 43.9 % (ref 36.0–46.0)
Hemoglobin: 10.7 g/dL — ABNORMAL LOW (ref 12.0–15.0)
Hemoglobin: 11 g/dL — ABNORMAL LOW (ref 12.0–15.0)
Platelets: 228 10*3/uL (ref 150–400)
RBC: 3.51 MIL/uL — ABNORMAL LOW (ref 3.87–5.11)
RDW: 14.6 % (ref 11.5–15.5)
RDW: 14.8 % (ref 11.5–15.5)

## 2010-06-28 LAB — URINE MICROSCOPIC-ADD ON

## 2010-06-28 LAB — COMPREHENSIVE METABOLIC PANEL
Albumin: 4 g/dL (ref 3.5–5.2)
Alkaline Phosphatase: 89 U/L (ref 39–117)
BUN: 22 mg/dL (ref 6–23)
Calcium: 10.3 mg/dL (ref 8.4–10.5)
Potassium: 3.6 mEq/L (ref 3.5–5.1)
Total Protein: 7.9 g/dL (ref 6.0–8.3)

## 2010-06-28 LAB — DIFFERENTIAL
Basophils Absolute: 0.2 10*3/uL — ABNORMAL HIGH (ref 0.0–0.1)
Basophils Relative: 0 % (ref 0–1)
Lymphocytes Relative: 16 % (ref 12–46)
Lymphocytes Relative: 24 % (ref 12–46)
Lymphs Abs: 1 10*3/uL (ref 0.7–4.0)
Lymphs Abs: 1.6 10*3/uL (ref 0.7–4.0)
Monocytes Absolute: 0.1 10*3/uL (ref 0.1–1.0)
Monocytes Absolute: 0.6 10*3/uL (ref 0.1–1.0)
Monocytes Relative: 2 % — ABNORMAL LOW (ref 3–12)
Monocytes Relative: 9 % (ref 3–12)
Neutro Abs: 4.2 10*3/uL (ref 1.7–7.7)
Neutro Abs: 4.9 10*3/uL (ref 1.7–7.7)

## 2010-06-28 LAB — BASIC METABOLIC PANEL
CO2: 25 mEq/L (ref 19–32)
Calcium: 8.8 mg/dL (ref 8.4–10.5)
Chloride: 105 mEq/L (ref 96–112)
GFR calc Af Amer: >60 mL/min (ref >60)
GFR calc non Af Amer: >60 mL/min (ref >60)
GFR calc non Af Amer: >60 mL/min (ref >60)
Glucose, Bld: 84 mg/dL (ref 70–99)
Potassium: 4 mEq/L (ref 3.5–5.1)
Potassium: 4.3 mEq/L (ref 3.5–5.1)
Sodium: 135 mEq/L (ref 135–145)
Sodium: 136 mEq/L (ref 135–145)

## 2010-06-28 LAB — URINALYSIS, ROUTINE W REFLEX MICROSCOPIC
Bilirubin Urine: NEGATIVE
Glucose, UA: NEGATIVE mg/dL
Specific Gravity, Urine: 1.02 (ref 1.005–1.030)
pH: 5.5 (ref 5.0–8.0)

## 2010-06-28 LAB — URINE CULTURE

## 2010-07-17 LAB — BASIC METABOLIC PANEL
BUN: 6 mg/dL (ref 6–23)
GFR calc non Af Amer: >60 mL/min (ref >60)
Glucose, Bld: 81 mg/dL (ref 70–99)
Potassium: 3.2 mEq/L — ABNORMAL LOW (ref 3.5–5.1)

## 2010-07-17 LAB — CBC
HCT: 31.6 % — ABNORMAL LOW (ref 36.0–46.0)
MCV: 95.9 fL (ref 78.0–100.0)
Platelets: 204 10*3/uL (ref 150–400)
RDW: 13.8 % (ref 11.5–15.5)

## 2010-07-18 LAB — CULTURE, BLOOD (ROUTINE X 2)
Culture: NO GROWTH
Culture: NO GROWTH

## 2010-07-18 LAB — CBC
HCT: 34.7 % — ABNORMAL LOW (ref 36.0–46.0)
Hemoglobin: 11.4 g/dL — ABNORMAL LOW (ref 12.0–15.0)
MCHC: 32.8 g/dL (ref 30.0–36.0)
MCV: 96.1 fL (ref 78.0–100.0)
Platelets: 208 10*3/uL (ref 150–400)
RBC: 3.61 MIL/uL — ABNORMAL LOW (ref 3.87–5.11)
WBC: 5.4 10*3/uL (ref 4.0–10.5)
WBC: 7.5 10*3/uL (ref 4.0–10.5)

## 2010-07-18 LAB — BASIC METABOLIC PANEL
CO2: 21 mEq/L (ref 19–32)
Chloride: 109 mEq/L (ref 96–112)
GFR calc Af Amer: >60 mL/min (ref >60)
Potassium: 3.9 mEq/L (ref 3.5–5.1)
Sodium: 136 mEq/L (ref 135–145)

## 2010-07-18 LAB — RAPID URINE DRUG SCREEN, HOSP PERFORMED
Barbiturates: NOT DETECTED
Benzodiazepines: NOT DETECTED
Cocaine: NOT DETECTED
Opiates: POSITIVE — AB

## 2010-07-18 LAB — WOUND CULTURE

## 2010-07-18 LAB — URINALYSIS, ROUTINE W REFLEX MICROSCOPIC
Bilirubin Urine: NEGATIVE
Nitrite: NEGATIVE
Specific Gravity, Urine: 1.019 (ref 1.005–1.030)
Urobilinogen, UA: 0.2 mg/dL (ref 0.0–1.0)

## 2010-07-18 LAB — COMPREHENSIVE METABOLIC PANEL
AST: 25 U/L (ref 0–37)
Albumin: 2.8 g/dL — ABNORMAL LOW (ref 3.5–5.2)
Alkaline Phosphatase: 104 U/L (ref 39–117)
Chloride: 99 mEq/L (ref 96–112)
Creatinine, Ser: 0.94 mg/dL (ref 0.4–1.2)
GFR calc Af Amer: >60 mL/min (ref >60)
Potassium: 3 mEq/L — ABNORMAL LOW (ref 3.5–5.1)
Total Bilirubin: 0.6 mg/dL (ref 0.3–1.2)

## 2010-07-18 LAB — URINE CULTURE
Colony Count: NO GROWTH
Culture: NO GROWTH

## 2010-07-18 LAB — DIFFERENTIAL
Basophils Absolute: 0 10*3/uL (ref 0.0–0.1)
Eosinophils Relative: 0 % (ref 0–5)
Lymphocytes Relative: 11 % — ABNORMAL LOW (ref 12–46)
Monocytes Absolute: 0.2 10*3/uL (ref 0.1–1.0)
Monocytes Relative: 3 % (ref 3–12)

## 2010-07-18 LAB — GLUCOSE, CAPILLARY: Glucose-Capillary: 137 mg/dL — ABNORMAL HIGH (ref 70–99)

## 2010-07-18 LAB — CLOSTRIDIUM DIFFICILE EIA

## 2010-07-19 LAB — CBC
MCHC: 34.1 g/dL (ref 30.0–36.0)
MCV: 99.9 fL (ref 78.0–100.0)
Platelets: 276 10*3/uL (ref 150–400)
RBC: 3.62 MIL/uL — ABNORMAL LOW (ref 3.87–5.11)
RDW: 16 % — ABNORMAL HIGH (ref 11.5–15.5)

## 2010-07-19 LAB — BASIC METABOLIC PANEL
CO2: 24 mEq/L (ref 19–32)
Calcium: 8.6 mg/dL (ref 8.4–10.5)
Chloride: 99 mEq/L (ref 96–112)
Creatinine, Ser: 1.04 mg/dL (ref 0.4–1.2)
Glucose, Bld: 119 mg/dL — ABNORMAL HIGH (ref 70–99)

## 2010-07-19 LAB — DIFFERENTIAL
Basophils Absolute: 0.2 10*3/uL — ABNORMAL HIGH (ref 0.0–0.1)
Basophils Relative: 2 % — ABNORMAL HIGH (ref 0–1)
Eosinophils Absolute: 0 10*3/uL (ref 0.0–0.7)
Monocytes Relative: 3 % (ref 3–12)
Neutrophils Relative %: 89 % — ABNORMAL HIGH (ref 43–77)

## 2010-07-23 LAB — PROTIME-INR: Prothrombin Time: 14.5 seconds (ref 11.6–15.2)

## 2010-07-23 LAB — COMPREHENSIVE METABOLIC PANEL
ALT: 13 U/L (ref 0–35)
CO2: 25 mEq/L (ref 19–32)
Calcium: 9 mg/dL (ref 8.4–10.5)
Creatinine, Ser: 1.03 mg/dL (ref 0.4–1.2)
GFR calc non Af Amer: 52 mL/min — ABNORMAL LOW (ref >60)
Glucose, Bld: 100 mg/dL — ABNORMAL HIGH (ref 70–99)
Sodium: 134 mEq/L — ABNORMAL LOW (ref 135–145)

## 2010-07-23 LAB — RETICULOCYTES: Retic Ct Pct: 0.9 % (ref 0.4–3.1)

## 2010-07-23 LAB — CHROMOSOME ANALYSIS, BONE MARROW

## 2010-07-23 LAB — CBC
HCT: 31.6 % — ABNORMAL LOW (ref 36.0–46.0)
HCT: 31.9 % — ABNORMAL LOW (ref 36.0–46.0)
HCT: 33.1 % — ABNORMAL LOW (ref 36.0–46.0)
HCT: 33.7 % — ABNORMAL LOW (ref 36.0–46.0)
Hemoglobin: 10.9 g/dL — ABNORMAL LOW (ref 12.0–15.0)
Hemoglobin: 11.1 g/dL — ABNORMAL LOW (ref 12.0–15.0)
Hemoglobin: 11.3 g/dL — ABNORMAL LOW (ref 12.0–15.0)
Hemoglobin: 12.6 g/dL (ref 12.0–15.0)
MCHC: 34.6 g/dL (ref 30.0–36.0)
MCHC: 34.7 g/dL (ref 30.0–36.0)
MCV: 95.3 fL (ref 78.0–100.0)
MCV: 96.8 fL (ref 78.0–100.0)
MCV: 98 fL (ref 78.0–100.0)
MCV: 98.4 fL (ref 78.0–100.0)
MCV: 98.6 fL (ref 78.0–100.0)
Platelets: 11 10*3/uL — CL (ref 150–400)
Platelets: 11 10*3/uL — CL (ref 150–400)
Platelets: 12 10*3/uL — CL (ref 150–400)
Platelets: 139 10*3/uL — ABNORMAL LOW (ref 150–400)
Platelets: 92 10*3/uL — ABNORMAL LOW (ref 150–400)
RBC: 3.1 MIL/uL — ABNORMAL LOW (ref 3.87–5.11)
RBC: 3.35 MIL/uL — ABNORMAL LOW (ref 3.87–5.11)
RBC: 3.69 MIL/uL — ABNORMAL LOW (ref 3.87–5.11)
RDW: 13.3 % (ref 11.5–15.5)
RDW: 13.4 % (ref 11.5–15.5)
RDW: 13.5 % (ref 11.5–15.5)
WBC: 3.4 10*3/uL — ABNORMAL LOW (ref 4.0–10.5)
WBC: 3.6 10*3/uL — ABNORMAL LOW (ref 4.0–10.5)
WBC: 5.1 10*3/uL (ref 4.0–10.5)
WBC: 5.3 10*3/uL (ref 4.0–10.5)
WBC: 5.8 10*3/uL (ref 4.0–10.5)
WBC: 7 10*3/uL (ref 4.0–10.5)

## 2010-07-23 LAB — CROSSMATCH
ABO/RH(D): A POS
Antibody Screen: NEGATIVE

## 2010-07-23 LAB — DIFFERENTIAL
Basophils Absolute: 0 10*3/uL (ref 0.0–0.1)
Basophils Absolute: 0 10*3/uL (ref 0.0–0.1)
Basophils Absolute: 0 10*3/uL (ref 0.0–0.1)
Basophils Absolute: 0 10*3/uL (ref 0.0–0.1)
Basophils Relative: 0 % (ref 0–1)
Basophils Relative: 0 % (ref 0–1)
Eosinophils Absolute: 0 10*3/uL (ref 0.0–0.7)
Eosinophils Absolute: 0 10*3/uL (ref 0.0–0.7)
Eosinophils Relative: 0 % (ref 0–5)
Eosinophils Relative: 0 % (ref 0–5)
Lymphocytes Relative: 13 % (ref 12–46)
Lymphocytes Relative: 14 % (ref 12–46)
Lymphocytes Relative: 19 % (ref 12–46)
Lymphs Abs: 0.8 10*3/uL (ref 0.7–4.0)
Lymphs Abs: 1 10*3/uL (ref 0.7–4.0)
Lymphs Abs: 1 10*3/uL (ref 0.7–4.0)
Lymphs Abs: 1.1 10*3/uL (ref 0.7–4.0)
Monocytes Absolute: 0.5 10*3/uL (ref 0.1–1.0)
Monocytes Absolute: 0.5 10*3/uL (ref 0.1–1.0)
Monocytes Absolute: 0.6 10*3/uL (ref 0.1–1.0)
Monocytes Absolute: 0.7 10*3/uL (ref 0.1–1.0)
Monocytes Absolute: 0.8 10*3/uL (ref 0.1–1.0)
Monocytes Relative: 10 % (ref 3–12)
Monocytes Relative: 12 % (ref 3–12)
Monocytes Relative: 9 % (ref 3–12)
Monocytes Relative: 9 % (ref 3–12)
Neutro Abs: 4.8 10*3/uL (ref 1.7–7.7)
Neutro Abs: 5.3 10*3/uL (ref 1.7–7.7)
Neutrophils Relative %: 84 % — ABNORMAL HIGH (ref 43–77)

## 2010-07-23 LAB — UIFE/LIGHT CHAINS/TP QN, 24-HR UR
Alpha 1, Urine: DETECTED — AB
Alpha 2, Urine: DETECTED — AB
Beta, Urine: DETECTED — AB
Free Kappa Lt Chains,Ur: 7.38 mg/dL — ABNORMAL HIGH (ref 0.04–1.51)
Free Kappa/Lambda Ratio: 8.79 ratio — ABNORMAL HIGH (ref 0.46–4.00)
Gamma Globulin, Urine: DETECTED — AB
Volume, Urine: 1275 mL

## 2010-07-23 LAB — PROTEIN ELECTROPH W RFLX QUANT IMMUNOGLOBULINS
Albumin ELP: 61.6 % (ref 55.8–66.1)
Beta 2: 3.7 % (ref 3.2–6.5)
Gamma Globulin: 12.6 % (ref 11.1–18.8)
M-Spike, %: NOT DETECTED g/dL

## 2010-07-23 LAB — URINALYSIS, ROUTINE W REFLEX MICROSCOPIC
Glucose, UA: NEGATIVE mg/dL
Ketones, ur: NEGATIVE mg/dL
pH: 5.5 (ref 5.0–8.0)

## 2010-07-23 LAB — POCT I-STAT, CHEM 8
Calcium, Ion: 1.17 mmol/L (ref 1.12–1.32)
Glucose, Bld: 116 mg/dL — ABNORMAL HIGH (ref 70–99)
HCT: 37 % (ref 36.0–46.0)
Hemoglobin: 12.6 g/dL (ref 12.0–15.0)

## 2010-07-23 LAB — URINE MICROSCOPIC-ADD ON

## 2010-07-23 LAB — BASIC METABOLIC PANEL
BUN: 37 mg/dL — ABNORMAL HIGH (ref 6–23)
Calcium: 9 mg/dL (ref 8.4–10.5)
GFR calc non Af Amer: 40 mL/min — ABNORMAL LOW (ref >60)
Glucose, Bld: 100 mg/dL — ABNORMAL HIGH (ref 70–99)
Sodium: 131 mEq/L — ABNORMAL LOW (ref 135–145)

## 2010-07-23 LAB — FERRITIN: Ferritin: 132 ng/mL (ref 10–291)

## 2010-07-23 LAB — IRON AND TIBC: UIBC: 188 ug/dL

## 2010-07-23 LAB — CEA: CEA: 2.2 ng/mL (ref 0.0–5.0)

## 2010-07-23 LAB — PREALBUMIN: Prealbumin: 22.5 mg/dL (ref 18.0–45.0)

## 2010-07-23 LAB — HEPATITIS C ANTIBODY: HCV Ab: NEGATIVE

## 2010-07-23 LAB — HAPTOGLOBIN: Haptoglobin: 124 mg/dL (ref 16–200)

## 2010-07-23 LAB — DIC (DISSEMINATED INTRAVASCULAR COAGULATION)PANEL
Fibrinogen: 308 mg/dL (ref 204–475)
Platelets: 10 10*3/uL — CL (ref 150–400)

## 2010-07-23 LAB — SAVE SMEAR

## 2010-07-23 LAB — CREATININE CLEARANCE, URINE, 24 HOUR
Collection Interval-CRCL: 24 hours
Creatinine: 1.19 mg/dL (ref 0.40–1.20)
Urine Total Volume-CRCL: 1275 mL

## 2010-07-23 LAB — HEPATITIS A ANTIBODY, IGM: Hep A IgM: NEGATIVE

## 2010-07-23 LAB — LACTATE DEHYDROGENASE: LDH: 121 U/L (ref 94–250)

## 2010-07-23 LAB — URINE CULTURE: Colony Count: >=100000

## 2010-08-25 NOTE — Consult Note (Signed)
[name], Mcgrath NO.:  [id number]   MEDICAL RECORD NO.:  [id number]          PATIENT TYPE:  INP   LOCATION:  1436                         FACILITY:  [facility]   PHYSICIAN:  Pierce Crane, MD        DATE OF BIRTH:  [birth year]   DATE OF CONSULTATION:  06/18/2008  DATE OF DISCHARGE:                                 CONSULTATION   REQUESTING PHYSICIAN:  Rosalyn Gess. Norins, MD   REASON FOR CONSULTATION:  Pancytopenia.   HISTORY OF PRESENT ILLNESS:  [name] is a pleasant 77 year old  white female with a complex medical history listed below, admitted after  routine labs at the [facility] primary care office showed a decrease of  platelets.  These labs were taken about 10 days ago.  Repeat labs on  June 17, 2008, revealed a platelet count of 30,000 as an outpatient.  She then was sent to the emergency department for further evaluation.  Her platelets at that time were 12,000 accompanied by increased bruising  but there were no signs of acute bleeding.  Per tech review large  platelets were seen in the films with decreased number and no  schistocytes were seen.  Other pertinent labs at this time are pending.  Her PT, INR and fibrinogen are normal.  Her D-dimer is slightly elevated  at 0.59.  Current platelet count 11,000.  No transfusion has been  received to date.  Her hemoglobin and hematocrit revealed anemia but the  patient has known anemia of chronic disease and her white count is 3.6.  We were asked to see the patient with recommendations.  Of note, the  patient carries a history of prior colon cancer in 1992 undergoing  partial colectomy, chemotherapy and radiation therapy.  At this time,  other information is not available.  Her platelets during last year and  dating back to January of 2001 were essentially normal.  Her hemoccult  is pending at this time.  Her abdominal ultrasound on March 9 showed a  normal spleen.  No splenic parenchymal abnormalities were  seen.   PAST MEDICAL HISTORY:  1. Remote history of colon cancer diagnosed in 1992 status post      partial colectomy, undergoing chemotherapy and radiation, cleared      since 2002.  The patient in the past has been treated by Dr.      Cyndie Chime.  2. Osteoarthritis.  3. Hypertension.  4. History of hiatal hernia and ventral hernia.  5. Glaucoma.  6. Blindness.  7. History of microscopic colitis.  8. Hard of hearing.  9. Known iron deficiency anemia dating back to at least 2004 or      earlier.  10.Hyperparathyroidism.  11.ASCVD.  12.DNR.   SURGERY AND PROCEDURES:  1. Status post left total hip replacement in February 2001, Dr.      Thersa Salt.  2. Status post partial colectomy in 1992.   ALLERGIES:  PENICILLIN, BEXTRA.   MEDICATIONS:  1. Tiazac 120 mg daily.  2. Colace 100 mg b.i.d.  3. MS Contin 30 mg q.h.s.  4. Timoptic drops as prescribed.  5. Tylenol  p.r.n.   REVIEW OF SYSTEMS:  These were reported by daughter.  The patient is  hard of hearing and is lethargic at this time.  Her daughter states that  she has been experiencing some dyspnea on exertion but no shortness of  breath at rest.  She did have some weight loss of unknown amount.  Her  weight up to last week was about 127 pounds.  She has GERD symptoms,  chronic.  During the last week the patient has been experiencing  intermittent nausea and constipation.  No vomiting or diarrhea were  seen.  The daughter denies any notice of blood in the stools or dark  stools of the patient.  She has had increasing fatigue.  She also has  been more lethargic for the last week.  No apparent chest pain.  The  rest of the review of systems appear to be negative.   FAMILY HISTORY:  Mother died with ovarian cancer.  Father died with MI.  One brother is in good health.  She has had one uncle with leukemia.   SOCIAL HISTORY:  The patient is divorced.  She has three children, one  with stage 4 melanoma treated at [facility].   No tobacco or alcohol  history.  The patient lives in [place].  She is [employer].  Last  colonoscopy in the year 2007.   PHYSICAL EXAMINATION:  GENERAL:  This is a frail 77 year old white  female in no acute distress, somewhat lethargic, able to follow  commands.  VITAL SIGNS:  Blood pressure 144/70, pulse 85, respirations 18,  temperature 97.7, pulse oximetry 100% in room air.  Weight 55.7 kg,  height 66 inches.  HEENT:  Normocephalic, atraumatic.  The patient has glaucoma and is  blind.  No thrush.  NECK:  Supple.  No cervical or supraclavicular masses.  LUNGS:  Clear to auscultation bilaterally.  CARDIOVASCULAR:  Regular rate and rhythm without murmurs, rubs or  gallops.  ABDOMEN:  Soft, nontender.  Bowel sounds x4.  No hepatosplenomegaly.  EXTREMITIES:  With no clubbing or cyanosis.  No edema.  No inguinal  masses.  SKIN:  Remarkable for significant bruising in her arms, elbows and the  left lower extremity, but no petechial rash is seen.  BREASTS:  Not examined.  GU:  Deferred.  RECTAL:  Deferred.  MUSCULOSKELETAL:  No spinal tenderness.  NEURO:  The patient is blind, otherwise DTRs equal bilaterally and  strength equal as well.   LABS:  Hemoglobin 11.1, hematocrit 33.2, white count 3.6, platelets 11  today.  MCV 98.4, ANC 3.8 for a white count of 5.1 in March 8, monocytes  0.5, lymphocytes 0.8, PTT 35, PT 14.5, INR 1.1.  Sodium 134, potassium  4.4, BUN 28, creatinine 1.03, glucose 100, total bilirubin 0.9, alkaline  phosphatase 72, AST 16, ALT 13, total protein 5.6, albumin 3.6, calcium  9.0.  All these tests are pending at this time including anemia panel,  LDH, ANA, Coombs, SPEP and UPEP and a myeloma panel.  Hemoccult is  pending as well.   ASSESSMENT AND PLAN:  Dr. Donnie Coffin has seen and evaluated the patient and  reviewed the chart.  This is an 77 year old woman with known history of  colon cancer status post hemicolectomy undergoing chemo radiation.  The  patient has  been clear since the year 2002.  She presented with abnormal  labs with decreased platelets.  Previous labs in the hospital have shown  normal platelet count in the smear.  No  schistocytes and also normal RBC  and morphology.  DIC panel is negative.  The ultrasound of the spleen is  normal.  The exam shows no adenopathy or splenomegaly.  Her CBC today  shows low platelets, unremarkable hemoglobin and normal white count.  The patient almost certainly has ITP (idiopathic thrombocytopenic  purpura).  Will begin empiric IVIG and p.o. prednisone and will follow  closely her platelet count.   Thank you very much for allowing [name] the opportunity to participate in  the care of this nice patient.      Marlowe Kays, P.A.      Pierce Crane, MD  Electronically Signed    SW/MEDQ  D:  06/20/2008  T:  06/20/2008  Job:  [id number]   cc:   Dr Everardo All

## 2010-08-25 NOTE — Op Note (Signed)
NAMERAYCHELL, HOLCOMB NO.:  [id number]   MEDICAL RECORD NO.:  [id number]          PATIENT TYPE:  INP   LOCATION:  1436                         FACILITY:  [facility]   PHYSICIAN:  Genene Churn. Granfortuna, M.D.DATE OF BIRTH:  [birth year]   DATE OF PROCEDURE:  06/19/2008  DATE OF DISCHARGE:                               OPERATIVE REPORT   PROCEDURE NOTE:  Right posterior iliac crest bone marrow aspiration and  biopsy done under 2% local lidocaine anesthesia, 4 mg morphine and 0.5  mg lorazepam IV premedication.  No complications.   INDICATIONS:  Unexplained pancytopenia with predominant thrombocytopenia  in an elderly woman.      Genene Churn. Cyndie Chime, M.D.  Electronically Signed     JMG/MEDQ  D:  06/19/2008  T:  06/19/2008  Job:  [id number]   cc:   Michiel Cowboy, MD   Sean A. Everardo All, MD  [address]  [place]  [place] [address]

## 2010-08-25 NOTE — H&P (Signed)
NAMEJASMEET, MANTON NO.:  [id number]   MEDICAL RECORD NO.:  [id number]          PATIENT TYPE:  EMS   LOCATION:  ED                           [facility]ACILITY:  [facility]   PHYSICIAN:  Michiel Cowboy, MDDATE OF BIRTH:  November 15, 1925   DATE OF ADMISSION:  06/17/2008  DATE OF DISCHARGE:                              HISTORY & PHYSICAL   PRIMARY CARE PHYSICIAN:  Dr. Everardo All.   CHIEF COMPLAINT:  Abnormal labs.  The patient is an 77 year old female  with a past medical history significant for total blindness, hard of  hearing, hypertension, and remote colon cancer.  The patient, per  routine labs, was noted to have decreased platelets, I think about a  week or so ago.  They repeated it today and it was 30, at which point  they asked the patient to present to the emergency department.  In the  emergency department the platelet count was noted to be 12, at which  point the [facility] hospitalist was called for an admission.  Per family,  she has been having some increased bruising but no signs of bleeding.  The patient has a history of microscopic colitis and a remote history of  melena, but the family is unsure if this has been going on any time  recently.  She does not have any significant anemia.  Otherwise no  bleeding, no falls.  The only change her recent medications is that her  morphine has increased slightly.  She takes morphine for chronic  arthritis but otherwise has not had any recent changes.  The only  medications she had been taking she has been taking them for years  unchanged.  She has lost a little bit of weight and, per family, just  overall has been feeling weaker lately.  Otherwise review of systems is  unremarkable.  No chest pain.  No particular shortness of breath.  There  are no fevers, no chills.   PAST MEDICAL HISTORY:  1. Osteoarthritis.  2. Hypertension.  3. History of hiatal hernia.  4. Hyperlipidemia.  5. Ventral hernia.  6. Glaucoma.  7.  Microscopic colitis.  8. Remote history of colon cancer in 1992, status post surgical      partial colectomy and chemotherapy and radiation therapy.  The      patient has been cleared since 2002 and was followed back then Dr.      Cyndie Chime.   FAMILY HISTORY:  No bleeding disorders.  Otherwise family history  noncontributory.   ALLERGIES:  PENICILLIN.   MEDICATIONS:  1. Crestor 40 mg daily.  2. Omeprazole 20 mg daily.  3. Avalide 300/25 mg daily.  4. Diltiazem 120 daily.  5. Sulfasalazine 500 twice a day.  6. Hydrocodone/acetaminophen as needed for pain.  7. Morphine 30 mg at bedtime.  8. Isotol eye drops to both eyes once a day at bedtime.  9. Meclizine 12.5 as needed for vertigo.  10.Phenergan 25 daily.  11.Aspirin 81 daily.  12.__________ 150 two tablets a day.   VITALS:  Temperature 97.5, blood pressure 126/82, pulse 75, respirations  20, saturating 98% on room air.  The patient appears to be a frail  elderly female in no acute distress.  HEAD:  Nontraumatic.  Somewhat dryish mucous membranes but normal skin  turgor.  LUNGS:  Clear to auscultation bilaterally.  HEART:  Regular rate and rhythm.  No murmurs appreciated.  ABDOMEN:  Soft, nontender.  There is a ventral hernia present.  LOWER EXTREMITIES:  Without clubbing, cyanosis or edema.  There are  multiple bruises present all over the skin in the upper and lower  extremities.  No petechiae are noted.   LABS:  White blood cell count 5.1, hemoglobin 12.6, platelets 12, large  platelets noted.  No schistocytes.  The rest of the labs:  Sodium 134,  potassium 4.3, creatinine 1.2.  It is known the patient has a baseline  creatinine of around 1.1-1.3.   ASSESSMENT AND PLAN:  This is an 77 year old female with progressive  thrombocytopenia.  Differential could be could broad, could be drug-  induced, could be secondary to idiopathic thrombocytopenic purpura or a  malignancy.  Will stop all potentially offending  medications.  I will  have pathology review a blood smear.  Transfuse for platelets below  10,000 or if the patient shows any signs of bleeding.  Strongly  recommend a hematology consult in the a.m.  Consider maybe needing a  bone marrow biopsy.  Will check a TSH and a Coombs DIC panel, myeloma  panel.  Will do a CBC q.6 h. in next 24 hours. If platelets continue to  drop rapidly, make sure she has a type and screen available.  I  explained to the patient and family most important for [name] is to figure  out why her platelets are dropping, that a transfusion of platelets by  itself is not going to solve her problem but may buy [name] some time if  this becomes necessary to do so.   Prophylaxis.  Good p.o. intake and sequential compression devices.  We  will avoid Lovenox or heparin products.  The patient has been recently  exposed to heparin.  Will avoid Protonix.   History of hypertension.  Will only continue diltiazem.   History of pain.  Will continue morphine and Tylenol.   Code status.  The patient is do not resuscitate/do not intubate.      Michiel Cowboy, MD  Electronically Signed     AVD/MEDQ  D:  06/18/2008  T:  06/18/2008  Job:  [id number]   cc:   [name], MD  [address]  [place]  [place] [address]

## 2010-08-25 NOTE — H&P (Signed)
NAMEEBELIN, DILLEHAY            ACCOUNT NO.:  [id number]   MEDICAL RECORD NO.:  [id number]          PATIENT TYPE:  INP   LOCATION:  0104                         FACILITY:  [facility]   PHYSICIAN:  Virgie Dad, MD     DATE OF BIRTH:  1925/06/05   DATE OF ADMISSION:  12/08/2008  DATE OF DISCHARGE:                              HISTORY & PHYSICAL   PRIMARY CARE Nalda Shackleford:  , Dr. Everardo All.   CHIEF COMPLAINT:  Caregiver had stated that this morning the patient was  restless and since this afternoon has been lethargic and obtunded.   HISTORY OF PRESENT ILLNESS:  This is a 77 year old white female, known  hypertensive, known history of cancer of the colon, known  hyperlipidemia, known hiatal hernia, known gastr0- esophageal reflux,  and a past history of idiopathic thrombocytopenic purpura is admitted  for workup regarding altered mental status, ending up with lethargy and  obtundation.   The patient has under care at home by his daughter and caregiver. 77  Caregiver stated this morning the patient was restless and seemed to be  having some abdominal pain, and later on the patient became became  lethargic, obtunded, and hard to awaken.   Workup in the emergency room showed a temperature of 99 with a blood  pressure of 160/90, pulse of 80, respirations 20 and oxygen saturation  of 97%.   The laboratory study show blood sugar 137.  The patient is not diabetic.  White count 7500 with shift to the left 87%, hemoglobin 12.8, hematocrit  38.3, and now platelets 208,000.  Sodium 132, potassium 3, chloride 99,  carbon dioxide 24, blood sugar 132, BUN 23, creatinine 0.94, serum  calcium 8.8, and liver enzymes are normal.  Lactic acid is 1.9.  Urinalysis showed large hemoglobin.  The patient does have an indwelling  Foley catheter which was inserted about 5 days ago after she had been  treated for urinary tract infection with 7 days of once-a-day IM  Rocephin, finished 5 days ago.  Leukocyte  esterase is small, urine WBC  is 0 to 2, and bacteria is rare.   Acute abdomen series show diffuse gaseous distention of the bowel with  large stool ball seen in the colon.  Findings compatible with fecal  impaction.  Massive hiatus hernia.   CT of the head:  No acute findings, extensive chronic microvascular  ischemic change, chronic left maxillary sinusitis .also the following  findings on   acute abdominal series are; the chest is clear with bibasilar  atelectatic change, very large hiatus hernia, and heart size is normal.   While in ED, the low potassium was repleted with IV potassium.  She was  given Zofran 4 mg IV for nausea and vomiting.  After pan culture of the  blood, urine and a stage 3 decubitus sacral ulcer, vancomycin initial  dose 1 gram IV was started.  While in ED, the patient had a sticky bowel  movement. 77  According to the caregiver, she has had diarrhea on and off,  even before the Rocephin.   Past history includes several eyes surgeries for glaucoma, and in spite  of several eye surgeries surgery, she ended up totally blind both eyes.  She has had colon cancer operation without colostomy, status post right  hip replacement and status post several abdominal hernia repair.   SOCIAL HISTORY:  She was admitted to a nursing home after her episode of  IPP, idiopathic thrombocytopenic purpura, in March 2010 after which she  has been at home with home care and her daughter.  She does not smoke.  She does not drink.   She takes the following medications:  Avalide 300 gas 12.5 twice a day,  hydrocodone/acetaminophen 5/500 one every 6 hours p.r.n. for pain,  Phenergan 12.5 mg p.o. p.r.n., Celexa 40 mg daily, levothyroxine 25 mcg  daily, Protonix 40 mg daily, vitamin C 1.25 once daily, Cardizem 30 mg  twice a day, alendronate 70 mg once a week.   She is allergic to CIPRO, MACROBID, PENICILLIN, and SULFA.   REVIEW OF SYSTEMS:  Per caregiver, the patient has been  bedridden,  complaining of arthritis, especially the shoulders and the hip, and has  to be given hydrocodone with acetaminophen 1 every 6 hours p.r.n.  Caregiver also says that the patient has a hiatal hernia, which is  symptomatic and has acid reflux with periods of nausea and vomiting for  which Phenergan is given p.r.n. and is on Protonix 40 mg daily.  According to caregiver, the patient has had chronic diarrhea even before  administration of IV Rocephin, and the patient has diarrhea every day  and noted while in ED she did have loose bowel movement, even if the KUB  shows she has fecal impaction, in spite of the daily laxative MiraLax.  She has had a urinary tract infection, the most recent one was given 7  days of Rocephin IM, finished 5 days ago, but the patient nevertheless  required Foley catheter to be inserted because the patient could not go  on her own or became incontinent even after 7 days of antibiotic.  The  patient has been bedridden and needs to be rolled on the side because  she has a sacral decubitus, which the family has regular appointment  with the wound clinic and states the decubitus with minimal tunneling  has improved with wound care and also topical silver nitrate ointment  daily.   PHYSICAL EXAMINATION:  VITAL SIGNS: 99.9 temperature, 140/80 blood  pressure, was 169/99 on admission, pulse 83, respiration 20, oxygen  saturation 97% at room air.  SKIN:  Warm and dry.  She has old abrasion, lower leg.  Has a stage 3  decubitus about 1 cm at its widest diameter with some purulence, which  was cultured.  HEENT: Pupils are equal but sluggish response to light and accommodation  the patient is wine according to care giver and family tongue is moist  and midline.  There is no facial symmetry.  Neck is supple.  Carotids are quiet.  Thyroid gland is normal in size.  Neck veins are not distended.  Chest is clear to palpation and auscultation.  COR:  88 per minute.   S1 and S2 are normal.  Grade II to III systolic  murmur, aortic area faintly radiated to the neck.  No gallop.  Abdomen is markedly distended.  Bowel sounds are normoactive.  Periumbilical tenderness but no guarding.  EXTREMITIES: No sign of pedal edema, no sign of DVT.  Range of motion is  not limited.  NEUROLOGIC:  The patient confirmed by ED doctor to be obtunded and  lethargic  and had to be given Maalox and IV fluids.  When I examined  her, she was already awake and recognizes upon hearing voices of  familiar family members and the care giver.  Complaining of  periumbilical pain.  Asking for pain medication.  The patient is blind,  as far as cranial nerves are examined.  The rest are normal.  Spinal  nerves normal.  No paresis.  Good motor tone.  Reflexes are normal.  No  Babinski.  Gait not tested.   ASSESSMENT AND PLAN:  This is an 77 year old bedridden white female,  known hypertensive, totally blind, who had just finished 7 days of IM  Rocephin for urinary tract infection.  Was brought to the emergency room  for the following:  1. Altered mental status with lethargy and obtundation, resolved after      IV fluid hydration and repletion of potassium .  Urine drug screen      pending.  2. Possible urosepsis, although as stated, the patient finished 7 days      of IM Rocephin 5 days ago.  Nevertheless, urine is recultured.      Blood is recultured and decubitus ulcer recultured.  3. Hypertension.  Continue Avalide and Cardizem.  4. Gastroesophageal reflux disease with reflux.  Continue Protonix.  5. Hypothyroid.  Continue 25 mcg of levothyroxine.  6. Questionable depression:  Did not continue Celexa since the patient      just came out of lethargy and obtundation.  7. Decubitus ulcer:  Cultured.  Wound care clinic consulted.  Will      wait for advice from the wound care for local wound care of the      sacrum decubitus, stage 3.   1. Deep venous thrombosis prophylaxis with  Lovenox. platelet count      258,000   PRONOSIS:GUARDED  ETHICS:DO NOT RESUSCITATE(Per daughter Larita Fife.      Virgie Dad, MD  Electronically Signed     EA/MEDQ  D:  12/08/2008  T:  12/08/2008  Job:  [id number]

## 2010-08-25 NOTE — Discharge Summary (Signed)
[name], [name]            ACCOUNT NO.:  [id number]   MEDICAL RECORD NO.:  [id number]          PATIENT TYPE:  INP   LOCATION:  1436                         FACILITY:  [facility]   PHYSICIAN:  Rosalyn Gess. Norins, MD  DATE OF BIRTH:  [birth year]   DATE OF ADMISSION:  06/17/2008  DATE OF DISCHARGE:  06/24/2008                               DISCHARGE SUMMARY   ADMISSION DIAGNOSIS:  Thrombocytopenia.   DISCHARGE DIAGNOSIS:  Idiopathic thrombocytopenic purpura, responding  nicely to therapy.   CONSULTANTS:  Dr. Cephas Darby for Hematology/Oncology.   PROCEDURES:  1. Ultrasound of the abdomen performed on March 9 which showed normal      sized spleen with no focal splenic parenchymal abnormalities.      Stable spleen size since prior CT scan of the abdomen.  2. Bone marrow aspiration biopsy performed by Dr. Cyndie Chime.   HISTORY OF PRESENT ILLNESS:  [name] is a delightful 77 year old  woman with a past medical history significant for total blindness, loss  of hearing, hypertension, and remote colon cancer.  The patient per  routine labs by Dr. Everardo All was found to be thrombocytopenic.  This was  repeated and her platelet count had dropped to 30 at which point she was  referred to emergency department where she was found have a platelet  count of 12,000.  Fortunately the patient had no signs of bleeding and  only minimal signs of increased bruising.  Because of her  thrombocytopenia.  She was admitted to hospital for further evaluation.   Please see the H and P for past medical history, family history, social  history, and admission physical exam.   HOSPITAL COURSE:  1. The patient was admitted with profound thrombocytopenia but did not      require transfusion of platelets because she remained without      active bleeding.  Her fibrinogen level was 308.  D-dimer was mildly      elevated at 0.59.  INR was 1.0.  The patient was seen in      consultation by Dr. Cyndie Chime  for the hematology service.  She      was diagnosed as having ITP and was started on IVIG and prednisone.      She did have a bone marrow biopsy.  Results are pending at time of      discharge.  She did have peripheral blood film reviewed by Dr.      Cyndie Chime confirming the thrombocytopenia.  The patient did do      well with her IVIG and was stable on oral prednisone.  She was      medically stable thought to be able to return to home.  During her      hospitalization the patient had PT and OT evaluation and it was      felt that given her blindness and her weakness from her recent      illness that she would need 24-hour a day care.  She had been      living alone.  Her daughters both work and were not going to be  able to provide this for her.  The patient therefore is going to be      transferred to a skilled care facility where she will continue her      convalescence.  2. UTI.  The patient developed a low grade fever and chills.  She had      a positive urinalysis and was diagnosed as having a urinary tract      infection.  She was started on IV Cipro and this will be converted      to oral Cipro.  Urine culture from March 13 did show greater 1000 E-      coli.  Sensitivities were pending but presumed sensitive.   DISCHARGE EXAMINATION:  Temperature was 98.6, blood pressure 128/60,  heart rate 65, respirations 16, O2 sats 94% on room air.  GENERAL APPEARANCE:  This is a delightful elderly woman who is  essentially blindness who was in no acute distress.  She has hearing  aids in place and is hard of hearing but is able to understand me well  with careful slow speech.  HEENT:  Exam was otherwise unremarkable.  NECK:  Supple.  CHEST:  Patient is moving air well.  There were no wheezes, no rales, no  increased work of breathing.  CARDIOVASCULAR:  2+ radial pulse.  Her precordium was quiet.  She had a  regular rate and rhythm.  ABDOMEN:  Soft.  No guarding, no tenderness, no  rebound.  GENITALIA AND RECTAL:  Exams deferred.  EXTREMITIES:  There is no edema noted in her lower extremities.  No  further examination conducted.   LABORATORY DATA:  Final laboratory:  Urine culture as noted with 100,000  colonies of E-coli, sensitivities pending.  CBC from the day of  discharge with a white count 7000, hemoglobin 12.6 grams, hematocrit  35.8%, platelet count 172,000.  The patient did have hepatitis C  antibody drawn that was negative.  She had hepatitis A antibody that was  negative.  She had an ANA on March 9 that was negative.  She had an  anemia panel which revealed a total iron was normal at 52.  B12 was  normal at 661.  Hepatitis B surface antibody was negative.   DISCHARGE MEDICATIONS:  Diltiazem 120 mg p.o. daily, docusate sodium 100  mg b.i.d., Timoptic eye drops 0.5% 1 drop to each eye at bedtime,  prednisone 60 mg p.o. daily, ciprofloxacin 250 mg p.o. b.i.d. for an  additional 5 days, Tylenol p.r.n., MiraLax 17 grams p.o. daily.   The patient is to be transferred to a skilled care facility.  She will  need PT and OT evaluation and treatment.  The patient may follow all  nursing home facility protocols.   The patient will need follow-up with Dr. Riley Churches and family  needs to call arrange for this appointment.   The patient's condition at time of discharge dictation is medically  stable.      Rosalyn Gess Norins, MD  Electronically Signed     MEN/MEDQ  D:  06/24/2008  T:  06/24/2008  Job:  [id number]

## 2010-08-28 NOTE — Consult Note (Signed)
NAME:  [name], [name]                      ACCOUNT NO.:  [id number]   MEDICAL RECORD NO.:  [id number]                   PATIENT TYPE:  INP   LOCATION:  0340                                 FACILITY:  [facility]   PHYSICIAN:  Petra Kuba, M.D.                 DATE OF BIRTH:  01-21-1926   DATE OF CONSULTATION:  DATE OF DISCHARGE:                                   CONSULTATION   REASON FOR CONSULTATION:  Patient well known to me for history of rectal  cancer and followup colonoscopy was due last year; however, was not in the  mood. Had been iron deficient in the past followed by Dr. Cyndie Chime but  off her iron for at least a couple of years. However, according to Dr.  Everardo All her last hemoglobin in May was normal and now presents with  symptomatic microcytic anemia. She has not seen any blood but does not see  very well,  but has no specific GI complaints, specifically no upper tract  symptoms, swelling problems or change in bowel habits.   PAST MEDICAL HISTORY:  Pertinent for the rectal cancer with hernia surgeries  as well as arthritis and high blood pressure. She has had a hip replacement.   ALLERGIES:  PENICILLIN.   SOCIAL HISTORY:  Takes lots of ibuprofen and Aleve for her arthritis but  does not smoke or drink.   FAMILY HISTORY:  Noncontributory.   CURRENT MEDICATIONS:  Tenormin and Lotensin   REVIEW OF SYMPTOMS:  Pertinent for weakness, dizziness and the arthritis.  There are no urinary complaints or obvious blood in her urine and no other  bleeding problems.   PHYSICAL EXAMINATION:  GENERAL:  The patient is lying comfortably in bed.  Patient not examined today, will be prior to colonoscopy.   Labs are reviewed.  Her ___________.  Her BUN is slightly up, creatinine  normal. Hemoglobin 6.9 with an MCV of 67.  Normal platelet count and diff  except for 10.7% monocytes, normal being 9.3.   ASSESSMENT:  1. Iron deficiency symptomatic anemia.  2. History of rectal  cancer, here for colon screening  3. __________. The risks, benefits and methods of colonoscopy and     questionable endoscopy were discussed with     the patient and her daughter. __________nonsteroidal-induced ulcer, given     that she was guaiac negative __________. We will proceed tomorrow based     on patient preference with further workup and plans pending those     findings.                                               Petra Kuba, M.D.    MEM/MEDQ  D:  07/06/2002  T:  07/07/2002  Job:  [id number]   cc:  Sean A. Everardo All, M.D. [facility]

## 2010-08-28 NOTE — Discharge Summary (Signed)
NAME:  [name], [name]                      ACCOUNT NO.:  [id number]   MEDICAL RECORD NO.:  [id number]                   PATIENT TYPE:  INP   LOCATION:  0340                                 [facility]ACILITY:  [facility]   PHYSICIAN:  Rene Paci, M.D. [facility]          DATE OF BIRTH:  21-Sep-1925   DATE OF ADMISSION:  07/06/2002  DATE OF DISCHARGE:  07/09/2002                                 DISCHARGE SUMMARY   DISCHARGE DIAGNOSES:  1. Severe iron deficiency anemia, no evidence of GI source status post 4     unit packed red blood cells transfusion, stable.  2. Labile hypertension, improved.   DISCHARGE MEDICATIONS:  1. Tenormin 25 mg p.o. b.i.d.  2. Lotensin HCT  20/25 p.o. daily.  3. Catapres 0.1 mg p.o. b.i.d.  4. Protonix 40 mEq p.o. daily.  5. Nu-Iron 150 mg p.o. b.i.d. with meals.   PROCEDURE:  Colonoscopy and EGD performed on March 27 by Dr. Vida Rigger  showing small hiatal hernia ulcerations. Recommend avoid aspirin and NSAIDs  and continue long-term proton pump inhibitors. Iron supplementation as  outpatient. Repeat procedure p.r.n.   DISPOSITION:  The patient is discharged to home in medically stable and  improved condition.   FOLLOW UP:  With her primary care physician, Dr. Romero Belling, on an as  needed basis.   HOSPITAL COURSE BY PROBLEM:  1. SEVERE IRON DEFICIENCY ANEMIA.  [name] is a 77 year old woman with     known history of previous iron deficiency anemia secondary to slow     chronic GI oozing who had been previously treated with daily iron     supplementation since discontinued. On routine check in primary care     physician's office, she was found to have a hemoglobin level of 6 and     brought to the inpatient evaluation by GI to further investigate her     ongoing GI bleed. Dr. Ewing Schlein of GI was consulted and performed procedures     as described above. Small hiatal erosions:  essentially negative, no     indication of acute GI bleeding. Recommendations as  above. The patient     was transfused a total of 4 units PRBCs and her discharge hemoglobin was     10.6. This value had been stable and no hemodynamic compromise was     observed during this hospitalization. The patient will be continued on     her iron as reflected above in the hospital followup with repeat     hemoglobin as needed per her primary care physician.  2. LABILE HYPERTENSION.  The patient had systolic blood pressure ranging     into the 180's associated also with bradycardia in the 50s. According,     some of her medications were changed and are as reflected above. The     patient has good blood pressure control at time of discharge. This will     be followed up  on an ongoing basis by her primary care physician.    LABORATORY DATA AT DISCHARGE:  White count 6.2, hemoglobin 10.6, platelets  at 222, BMET within normal limits.                                               Rene Paci, M.D. [facility]    VL/MEDQ  D:  07/09/2002  T:  07/09/2002  Job:  [id number]

## 2010-08-28 NOTE — Op Note (Signed)
[name], [name]            ACCOUNT NO.:  [id number]   MEDICAL RECORD NO.:  [id number]          PATIENT TYPE:  INP   LOCATION:  6703                         FACILITY:  [facility]   PHYSICIAN:  [name], M.D.   DATE OF BIRTH:  Mar 03, 1926   DATE OF PROCEDURE:  04/15/2005  DATE OF DISCHARGE:  11/11/2004                                 OPERATIVE REPORT   PROCEDURE:  Colonoscopy with biopsy.   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 It has been about 3 years since her  last colonoscopy by Dr. Ewing Schlein.   FINDINGS:  Normal exam to the terminal ileum.   PROCEDURE:  The nature, purpose and risks of the procedure were familiar to  the patient from prior examinations. The risks were reviewed and she  provided written consent. Sedation was fentanyl 25 mcg and Versed 5  milligrams IV without clinical instability during the course of the  procedure.   Based on Dr. Marlane Hatcher previous difficult experience with a lot of looping  using the pediatric colonoscope, we opted for the adult adjustable video  colonoscope which was advanced with moderate difficulty. There was an  angulation in the sigmoid region which was traversed more easily after  putting the patient in the supine position, and then we needed external  abdominal compression to help get around a tight turn in the hepatic  flexure. Ultimately, I reached the cecum and entered the terminal ileum for  short distance, which appeared normal. Pullback was then performed. The  quality of prep was excellent and it is felt that all areas were well seen.   This was a normal examination. I  did not see any mucosal abnormalities such  as colitis to account for her diarrhea, nor any vascular to account for her  anemia. No polyps or recurrent cancer were observed in view of her prior  history of rectal cancer and indeed her anastomosis was not really  endoscopically apparent.   Random mucosal biopsies were obtained in the proximal and distal colon to  rule out microscopic or collagenous colitis. Retroflexion in the rectum and  reinspection of the rectum were unremarkable.   No diverticular disease was noted during this exam.   The patient tolerated the procedure well and there no apparent  complications.   IMPRESSION:  1.  Abnormal radiographic appearance of the cecum on CT scanning (793.4),      without any corresponding abnormalities noted colonoscopically. This is      felt to probably be anatomic variants related to the ileocecal valve.  2.  Intermittent diarrhea without endoscopically evident source on this exam      (564.5), path pending to rule out microscopic colitis.  3.  Anemia, without source evident on current examination (285.9), possibly      related to  the patient's large hiatal hernia.   PLAN:  1.  Await pathology results.  2.  Consider discontinuation of misoprostol to see if the diarrhea improves,      especially if the biopsies of the colonic mucosa come back normal.  3.  Consider hiatal hernia repair as therapy for the patient's anemia.           ______________________________  [name], M.D.     RB/MEDQ  D:  04/15/2005  T:  04/15/2005  Job:  [id number]   cc:   [name], M.D. [facility]  [address]  [place]  [place] [address]   Petra Kuba, M.D.  Fax: [phone]

## 2010-08-28 NOTE — Discharge Summary (Signed)
[name], [name]            ACCOUNT NO.:  [id number]   MEDICAL RECORD NO.:  [id number]          PATIENT TYPE:  INP   LOCATION:  6703                         FACILITY:  [facility]   PHYSICIAN:  [name], M.D. LHCDATE OF BIRTH:  1926/03/22   DATE OF ADMISSION:  04/14/2005  DATE OF DISCHARGE:  04/15/2005                                 DISCHARGE SUMMARY   DISCHARGE DIAGNOSES:  1.  Melena with heme-negative stool.  2.  Anemia likely secondary to large hiatal hernia.   HISTORY OF PRESENT ILLNESS:  Patient is a 77 year old white female who is  admitted with melena and abdominal pain as well as possible hypotension.  According to the patient she described intermittent diarrhea times several  weeks.  She also described diffuse abdominal pain.  The patient was admitted  for further evaluation.   PAST MEDICAL HISTORY:  1.  Colon cancer.  2.  Hypertension.  3.  Osteoporosis.  4.  Legal blindness.  5.  Hard of hearing.  6.  Status post total hip replacement 2001 on the left.   HOSPITAL COURSE:  MELENA:  The patient was admitted.  Stool was checked for  blood and was heme-negative.  The patient was evaluated by GI and was seen  by Dr. Matthias Mcgrath.  The patient underwent the colonoscopy on April 15, 2005  which was negative.  In addition, the patient had an EGD performed  approximately one week ago which showed a large hiatal hernia.  GI  recommended that her diarrhea may be secondary to misoprostol which was  given presumably for prophylaxis to Voltaren-induced gastropathy.  He  recommended substituting a proton-pump inhibitor instead.  In addition, he  recommended that if no alternative source of anemia is found that is may be  due to her large hiatal hernia and she might benefit from surgical repair of  it.  At this time we are awaiting an upper GI series prior to discharge to  look at the anatomy and questionable torsion of the hiatal hernia per Dr.  Matthias Mcgrath.  If unrevealing she  may need an outpatient CT of the abdomen.  The  patient reportedly had a CT six months ago.  This is not repeated during  this admission.  Patient's daughters are concerned about patient's recent 10  pound weight loss.  Consider performing CT abdomen as an outpatient.  Will  defer to primary M.D. and Dr. Matthias Mcgrath.   DISCHARGE MEDICATIONS:  1.  Avalide 300/25 one tablet p.o. daily.  2.  Lasix 20 mg p.o. daily.  3.  Meclizine 12.5 mg as needed.  4.  Diclofenac 75 mg p.o. b.i.d.  5.  Protonix 40 mg p.o. daily.  6.  Trusopt and Timoptic eye drops as before.   DISCHARGE LABORATORIES:  Patient's hemoglobin at time of discharge is 9.9.   FOLLOW-UP:  The patient is scheduled to follow up with Dr. Everardo Mcgrath on  Tuesday, January 9 at 3 p.m.  At this visit she will need reevaluation of  her p.o. intake as well as a CBC.   DISCHARGE INSTRUCTIONS:  Patient is instructed to call Dr. Everardo Mcgrath should  she develop nausea, vomiting, abdominal pain, or bright red blood in stool  or go directly to the emergency room.      Dawn S. Peggyann Juba, NP      [name], M.D. [facility]  Electronically Signed    MSO/MEDQ  D:  04/15/2005  T:  04/15/2005  Job:  [id number]   cc:   [name], M.D. [facility]  [address]  [place]  [place] [address]   [name], M.D.  Fax: [phone]

## 2010-08-28 NOTE — H&P (Signed)
NAME:  [name], [name]                      ACCOUNT NO.:  [id number]   MEDICAL RECORD NO.:  [id number]                   PATIENT TYPE:  INP   LOCATION:  0340                                 FACILITY:  [facility]   PHYSICIAN:  Sean A. Everardo All, M.D. [facility]           DATE OF BIRTH:  1925-10-12   DATE OF ADMISSION:  07/06/2002  DATE OF DISCHARGE:                                HISTORY & PHYSICAL   REASON FOR ADMISSION:  Severe anemia.   HISTORY OF PRESENT ILLNESS:  The patient is a 77 year old woman who was seen  in our office yesterday with dizziness.  She had laboratory studies drawn.  Yesterday evening, she had the sudden onset of severe dizziness for a few  minutes.  It was a nonvertiginous quality.  She had associated DOE in the  fall.  She struck the left side of her head and required sutures to the area  at the [facility] Emergency Room.   PAST MEDICAL HISTORY:  1. Colon cancer.  She has been followed by Genene Churn. Cyndie Chime, M.D.  The     patient and her daughter state that Dr. Cyndie Chime told them that she     was cancer-free and did not need follow-up with him as of about two years     ago.  2. Hypertension.  3. Blindness.  Reason for this unknown to me.  4. Osteoporosis.   MEDICATIONS:  1. Lotensin HCT 10/12.5 mg one daily.  2. An uncertain type of eyedrop.  3. Fosamax 70 mg weekly.  4. Tenormin 50 mg daily.   SOCIAL HISTORY:  The patient's daughter, whose name is Lawernce Pitts, is  here.  The patient was widowed in 2002.   FAMILY HISTORY:  Negative for anemia.   REVIEW OF SYSTEMS:  The patient complains of a slight dry cough, weakness,  and fatigue.  She denies fever, chest pain, heartburn, nausea, vomiting,  rectal bleeding, hematuria, diarrhea, urinary incontinence, and bruising.   PHYSICAL EXAMINATION:  VITAL SIGNS:  Blood pressure 150/70, heart rate 60,  temperature 97.6 degrees.  WEIGHT:  152 pounds.  GENERAL APPEARANCE:  Pale and in no distress.   She does not appear ill.  SKIN:  Not diaphoretic.  HEENT:  Head:  There is a sutured laceration at the left parietal area with  no bleeding from it.  Sclerae nonicteric.  Pharynx clear.  NECK:  Supple.  CHEST:  Clear to auscultation.  CARDIOVASCULAR:  No JVD.  No edema.  Regular rate and rhythm.  No murmur.  Pedal pulses are intact.  ABDOMEN:  Soft, nontender.  No hepatosplenomegaly.  No mass.  Slightly  obese.  BREASTS AND GYNECOLOGIC:  Exams not done at this time due to the patient's  condition.  RECTAL:  No mass.  Hemoccult negative.  EXTREMITIES:  No ulcer is present on the feet.  The feet are of normal color  and temperature.  NEUROLOGIC:  Alert and  well oriented.  The patient moves all fours.  Gait is  observed in the office to be normal.  Sensation is intact to touch in the  feet.   LABORATORY DATA:  Laboratory studies were obtained in the office yesterday  and the results are now available.  Hemoglobin 6.9, MCV 67.5, WBC 6800,  platelets 281.  CMET remarkable only for a BUN of 32.  LDL 138.  TSH 3.03.   IMPRESSION:  1. Severe anemia probably due to chronic gastrointestinal blood loss or an     episode of blood loss at some point in the past few months.  It should be     noted that the patient was not anemia as of her last CBC on Sep 01, 2001.  2. A fall, which in retrospect is probably related to her anemia.  3. Other chronic medical problems as noted above.   PLAN:  1. Admit to [facility]  2. Check CPKs.  3. Full code at the request of the patient.  4. Transfuse.  I discussed with the patient the risks, benefits, and     alternatives to transfusion and she agrees.  5. Consult gastroenterology.  I have called the patient's     gastroenterologist, Petra Kuba, M.D.  He states that he will come to     see the patient later on today.  6. Up out of bed today only with assistance.  7. Recheck CBC tomorrow.  8. Will hold antihypertensives p.r.n. low normal  blood pressure.                                               Sean A. Everardo All, M.D. [facility]    SAE/MEDQ  D:  07/06/2002  T:  07/07/2002  Job:  [id number]

## 2010-08-28 NOTE — Op Note (Signed)
NAMEROSELYNN, WHITACRE            ACCOUNT NO.:  [id number]   MEDICAL RECORD NO.:  [id number]          PATIENT TYPE:  AMB   LOCATION:  ENDO                         FACILITY:  [facility]   PHYSICIAN:  Shirley Friar, MDDATE OF BIRTH:  05-03-1925   DATE OF PROCEDURE:  04/07/2005  DATE OF DISCHARGE:                                 OPERATIVE REPORT   PROCEDURE:  Upper endoscopy.   INDICATIONS FOR PROCEDURE:  Melena, history of anemia.   ANESTHESIA:  Fentanyl 25 mcg IV, Versed 2.5 mg IV.   FINDINGS:  The endoscope was inserted into the oropharynx and the esophagus  was intubated.  The esophageal lining was normal in its entirety.  A large  hiatal hernia was noted on intubation of the stomach.  The endoscope was  advanced down to the stomach which revealed a normal lining of the stomach  without any ulcers or bleeding noted.  There was normal appearing bilious  fluid noted in the distal stomach.  The endoscope was advanced down into the  duodenum and passed down to the distal second portion to the proximal third  portion of the duodenum.  Careful withdrawal revealed normal proximal third  and second portion of the duodenum.  The duodenal bulb was normal.  The  endoscope was withdrawn back into the stomach and retroflexion revealed  normal angularis, cardia, and fundus.  The endoscope was withdrawn  confirming the above findings.   ASSESSMENT:  1.  Large hiatal hernia.  2.  No bleeding source identified.  3.  No ulcers noted.   PLAN:  1.  Avoid NSAIDs if possible.  2.  Today, CBC showed a hemoglobin of 12.6, hematocrit 36.9, platelet count      352 with white blood count 7.8.  Sodium 141, potassium 3.5, chloride      110, CO2 20, BUN 45, creatinine 1.6, glucose 98, and calcium 8.8.  Based      on current CBC, the patient is not currently anemic.  3.  May need to consider further workup in the future if bleeding recurs or      worsens such as possible small bowel evaluation versus  repeat      colonoscopy versus iron therapy with close follow up.  Will defer the      decision on this      to her primary GI doctor, Dr. Ewing Schlein.  At this time, I see no indication      to do further workup.  4.  Follow up with Dr. Ewing Schlein in 1-2 weeks.  5.  Findings were discussed with patient's daughter.      Shirley Friar, MD  Electronically Signed     VCS/MEDQ  D:  04/07/2005  T:  04/07/2005  Job:  [id number]   cc:   Petra Kuba, M.D.  Fax: [phone]   Sean A. Everardo All, M.D. [facility]  [address]  [place]  [place] [address]

## 2010-08-28 NOTE — Op Note (Signed)
[place]  Patient:    [name]                    MRN: [id number] Proc. Date: 05/15/99 Adm. Date:  [id number] Attending:  Twana First                           Operative Report  PREOPERATIVE DIAGNOSIS:  Left hip degenerative joint disease.  POSTOPERATIVE DIAGNOSIS:  Left hip degenerative joint disease.  OPERATION PERFORMED:  Left total hip replacement using [employer] total hip system with 52 mm PSL press-fit acetabulum with two locking screws and 10 degree polyethylene liner.  Next, a #6 cemented ODC femoral component with +5 x 28 mm femoral head with a #2 cement plug and 10 mm centralizer.  SURGEON:  Elana Alm. Thurston Hole, M.D.  ASSISTANT:  Kirstin Adelberger, P.A.  ANESTHESIA:  General.  OPERATIVE TIME:  One hour and 40 minutes.  ESTIMATED BLOOD LOSS:  350 cc.  COMPLICATIONS:  None.  DESCRIPTION OF PROCEDURE:  The patient was brought to the operating room on May 15, 1999 and placed on the operating table in supine position.  After an adequate level of general anesthesia was obtained, her left hip was examined under anesthesia.  She has forward flexion of 110, extension to 0, internal and external rotation to 30 degrees.  She had approximately 1.5 cm of shortening on the left hip compared to the right.  She had a Foley catheter placed under sterile conditions. She received vancomycin 1 gm IV preoperatively for prophylaxis.  She was then turned to the left lateral up decubitus position and secured on the bed with the [facility] hip positioners.  The left hip and leg was prepped using Betadine and draped using sterile technique.  Originally, through a 25 cm posterolateral greater trochanteric incision, initial exposure was made.  The underlying subcutaneous tissues were incised in line with the skin incision.  The iliotibial band and gluteus maximus fascia was incised longitudinally as well.  The sciatic  nerve was carefully protected.  The short external rotators of the hip were carefully released off their femoral insertions along with the hip capsule and tagged. The hip was dislocated posteriorly.  Found to have significant grade 3 and 4 changes on the femoral head and the acetabulum.  Femoral neck cut was then made 1.5 cm above the lesser trochanter in the appropriate amount of inclination and anteversion.  The acetabulum was exposed.  Retractors carefully placed.  The labrum removed from around the acetabular edges.  The acetabulum was verh carefully reamed and since she had protrosio noted before surgery, no increased depth to the acetabulum was obtained.  An outer rim fit of 52 mm was found to be the appropriate size with he acetabular reamers.  The acetabular 52 mm trial was placed and found to be an excellent fit and then the actual 52 mm PSL acetabular cup was hammered into position with the appropriate amount of anteversion and inclination noted. After this was done with an excellent press-fit, two separate locking screws were placed in the 1 oclock and 12 oclock position in the acetabular shell.  Each one of these 16 to 20 mm screws placed with good purchase.  A 10 degree polyethylene liner was then placed in the posterolateral position and hammered into position locking it into an acetabular shell.  The proximal femur was then exposed.  The femoral  canal was reamed  with axial reamers up to a cement 6 size and then broached to  cement 6 size.  The broach left in place and then the +0 followed by the +5 femoral neck length 28 mm head was placed onto the broach and the hip reduced and taken  through a range of motion and with the +5 length was found to be in excellent range of motion with excellent stability, up to 70 to 80 degrees of internal rotation in both neutral and 30 degrees of adduction with excellent anterior stability.  As  well, leg lengths were  approximately restored with this length.  At this point he broach was removed.  The cement plug was sized.  A #2 was felt to be the appropriate size.  This was placed down the femoral canal.  The femoral canal was then jet lavage irrigated with 3L of saline solution.  After this was done, then cement was placed down the femoral canal and then the actual prosthesis #6 with a 10 mm centralizer was placed down the femoral canal with an excellent fit, excess cement being removed from around the edges of the femoral neck.  After the cement hardened, the +5 by 28 mm femoral head was hammered onto the femoral neck with n excellent Morse taper fit.  The hip reduced, taken through a range of motion, found to be stable.  Leg lengths equal.  At this point it was felt that all the components were of excellent size, fit and stability.  The wound was further irrigated with antibiotic solution.  The hip capsule and short external rotators were then repaired back to the femoral neck through two drill holes in the greater trochanter.  The iliotibial band and gluteus maximus fascia was closed with 2-0  Panacryl suture.  Subcutaneous tissues closed with 0 and 2-0 Vicryl.  Skin closed with skin staples.  Sterile dressings were applied.  A hip abduction pillow applied.  The patient turned supine, extubated and taken to the recovery room in stable condition.  Leg lengths were checked and found to be approximately equal. Rotation equal, pulses 2+ and symmetric.  Needle and sponge counts correct x 2 t the end of the case. DD:  05/15/99 TD:  05/16/99 Job: [id number]

## 2010-08-28 NOTE — Consult Note (Signed)
[name], Mcgrath NO.:  [id number]   MEDICAL RECORD NO.:  [id number]          PATIENT TYPE:  INP   LOCATION:  6703                         FACILITY:  [facility]   PHYSICIAN:  [name], M.D.   DATE OF BIRTH:  01-30-1926   DATE OF CONSULTATION:  04/14/2005  DATE OF DISCHARGE:                                   CONSULTATION   GASTROENTEROLOGY CONSULTATION.:  Dr. Alwyn Ren asked [name] to see this 77 year old  female because of melenic stool, anemia, questionable GI bleeding, and a  history of diarrhea.   [name] is known to my partner, Dr. Ewing Schlein.  She is many years status  post a low anterior resection for rectal cancer.  She has had intermittent  diarrhea.  Her most recent surveillance colonoscopy by him was about three  years ago and was unrevealing for any significant abnormalities.   Recently, she had heme-positive stool and anemia and underwent endoscopy by  Dr. Doy Mince, which showed no evidence of ulcers or other source of  bleeding but a large hiatal hernia was noted.  That exam was just about a  week ago.   Three years ago, at the same time as her colonoscopy, Dr. 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  She has been maintained on PPI therapy but  also is on aspirin. She is also on misoprostol.   She presented to the emergency room with apparent observation of dark, tarry  stool.  The patient is visually impaired, which may have made this  observation less accurate.  In any event, she also had abdominal pain and  had an orthostatic change in blood pressure and pulse, and it was noted that  her hemoglobin, following overnight hydration, had fallen to 10.6, as  compared to baseline of 12.6 just a week ago and 12.7 back in July.   Because of these findings, the patient was admitted and we were asked to see  the patient.   Note that, about 6 months ago, the  patient had an abdominal CT scan, which  showed a fullness in the cecum thought to probably be a lipomatous ileocecal  valve.  Because of this and because of a history of intermittent diarrhea,  colonoscopy was being contemplated by Dr. Ewing Schlein.   PAST MEDICAL HISTORY:  Pertinent for colon cancer status post remote low  anterior resection, up-to-date on screening colonoscopy, hypertension,  osteoporosis, visual and hearing impairment, and history of total hip  replacement.   ALLERGIES:  PENICILLIN.   OUTPATIENT MEDICATIONS:  Iron supplement, Avalide, nifedipine, Lasix,  calcitriol, Crestor, Darvocet, Fosamax, Voltaren, Cytotec, meclizine,  aspirin and apparently Protonix.  The pharmacy technician is at this time  trying to do a medication reconciliation to get a clearer idea as to the  exact to medicines and doses that the patient is using.   FAMILY HISTORY AND SOCIAL HISTORY:  Not obtained.   REVIEW OF SYSTEMS:  Not obtained.   PHYSICAL EXAMINATION:  GENERAL:  A pleasant, hard-of-hearing female in no  evident distress.  CHEST:  Clear.  CARDIAC:  Heart is without gallops, rubs, murmurs, clicks or arrhythmias.  ABDOMEN:  Benign without any obvious mass or tenderness.  RECTAL:  Olive-brown stool which is Hemoccult-negative on guaiac testing.   LABORATORY DATA:  White count 8900, hemoglobin 11.8, with MCV of 95 on  admission, follow up H&H is 10.6, platelets 275,000, 89 polys, 6 lymphs, 5  monocytes.  Chemistry panel pertinent for BUN of 34 and creatinine of 1.3.  When checked a week ago, was levels were 45 and 1.6, respectively.  Liver  chemistries normal except for bilirubin of 1.3 (was 1.6 a week ago). Albumin  2.9.   IMPRESSION:  1.  History of dark stool with 2 g drop in hemoglobin over the past week,      but currently heme-negative and without evidence of melena on exam.  2.  Anemia, normocytic.  3.  Intermittent diarrhea, possibly related to misoprostol.  4.  Remote  history of rectal cancer with colonoscopy negative for adenomas      less than three years ago.  At that time the terminal ileum was entered      and the ileocecal valve was visualized to be without abnormalities.  5.  Radiographic possible abnormality of the ileocecal valve, see above.   PLAN:  Colonoscopy with random mucosal biopsies tomorrow.  This should help  to address the issue of anemia, the abnormal radiographic appearance of the  cecum, and the patient's diarrhea.  The risks of the tests were reviewed  with the patient and she is agreeable.  Further management would depend on  the colonoscopic findings.  In the interim, I would favor a repeat  hemoglobin tomorrow but with heme-negative stool, it does not appear that  the patient is having active blood loss.           ______________________________  [name], M.D.     RB/MEDQ  D:  04/14/2005  T:  04/14/2005  Job:  [id number]   cc:   [name], M.D. [facility]  [address]  [place]  [place] [address]   Petra Kuba, M.D.  Fax: [phone]

## 2010-08-28 NOTE — Op Note (Signed)
NAME:  [name], SPARLIN                      ACCOUNT NO.:  [id number]   MEDICAL RECORD NO.:  [id number]                   PATIENT TYPE:  INP   LOCATION:  0340                                 FACILITY:  [facility]   PHYSICIAN:  Petra Kuba, M.D.                 DATE OF BIRTH:  04/22/1925   DATE OF PROCEDURE:  07/07/2002  DATE OF DISCHARGE:                                 OPERATIVE REPORT   PROCEDURE:  Colonoscopy.   INDICATION:  The patient with a history of colon cancer, overdue for colonic  screening.  Microcytic anemia.  Consent was signed after risks, benefits,  methods, options thoroughly discussed multiple times in the past and  yesterday with the family.   MEDICINES USED:  1. Demerol 40.  2. Versed 4.   DESCRIPTION OF PROCEDURE:  Rectal inspection was pertinent for external  hemorrhoids.  Digital exam was negative.  Pediatric video adjustable  colonoscope was inserted and with some difficulty due to a tortuous, looping  colon, was able to be advanced to the cecum.  This did require rolling her  on her back and some abdominal pressures.  No signs of bleeding or  abnormalities were seen.  Cecum was identified by the appendiceal orifice  and the ileocecal valve.  In fact, the scope was inserted a short ways into  the terminal ileum which was normal.  Photodocumentation was obtained.  The  scope was slowly withdrawn.  Prep was fairly adequate.  With lots of washing  and suctioning, adequate visualization was obtained but on slow withdrawal  through the colon, no abnormalities were seen.  Specifically, no polyps,  masses, AVM's, diverticula, or other signs of bleeding.  I believe the  anastomosis was at the rectosigmoid junction.  Once back in the rectum, the  scope was retroflexed, revealing some internal hemorrhoids.  The scope was  straightened and readvanced a short ways up the left side of the colon; air  was suctioned and scope removed.  The patient tolerated the  procedure well.  There was no obvious immediate complication.   ENDOSCOPIC DIAGNOSES:  1. Small internal-external hemorrhoids.  2. Otherwise, within normal limits to the cecum and the terminal ileum     without any blood seen.    PLAN:  1. Continue work-up with an EGD.  2. If doing well medically, repeat colon screening in five years if she is     willing to have it.  3.  Please see endoscopy dictation for further work-     up, plans, and recommendations.                                               Petra Kuba, M.D.    MEM/MEDQ  D:  07/07/2002  T:  07/07/2002  Job:  [id number]   cc:   [name], M.D. [facility]   Genene Churn. Cyndie Chime, M.D.  [address] [facility]  [place]  [place] [address]  Fax: [phone]

## 2010-08-28 NOTE — Discharge Summary (Signed)
. [facility]  Patient:    [name], [name]                   MRN: [id number] Adm. Date:  [id number] Disc. Date: [id number] Attending:  Faith Rogue T Dictator:   Kirstin Adelberger, P.A.-C.                           Discharge Summary  ADMISSION DIAGNOSES: 1. Left hip pain. 2. End-stage degenerative joint disease, left hip. 3. Hypertension. 4. History of colon cancer.  DISCHARGE DIAGNOSES: 1. End-stage degenerative joint disease, status post left total hip    replacement. 2. Hypertension. 3. End-stage degenerative joint disease.  HISTORY OF PRESENT ILLNESS:  The patient is a 77 year old white female who has ad a history of left hip pain for many months.  She has tried conservative treatment, including anti-inflammatories and physical therapy.  She understands the risks,  benefits, and possible complications of a left total replacement, and is without question.  HOSPITAL COURSE:  On May 15, 1999, the patient underwent a left total hip replacement and tolerated the procedure well.  The patient was admitted postoperatively for pain control, physical therapy, and deep vein thrombosis prophylaxis.  On postoperative day one, the patient was able to sit in a chair without difficulty.  T-Max was 99.2 degrees.  Hemoglobin 11.7.  On postoperative day two the patient was doing very well.  She was controlled on p.o. pain medicines.  T-Max 100.5 degrees, hemoglobin 10.4.  On postoperative day three the T-Max was 99.6 degrees, hemoglobin 10.5.  The patient was doing well with physical therapy and was transferred to rehabilitation for intensive physical therapy. ill continue to follow on rehabilitation.  DISCHARGE MEDICATIONS: 1. Lotensin. 2. Cosopt. 3. Nu-Iron. 4. Percocet for pain. 5. Coumadin per pharmacy protocol. DD:  06/28/99 TD:  06/28/99 Job: [id number]

## 2010-08-28 NOTE — Op Note (Signed)
NAME:  [name], [name]                      ACCOUNT NO.:  [id number]   MEDICAL RECORD NO.:  [id number]                   PATIENT TYPE:  INP   LOCATION:  0340                                 FACILITY:  [facility]   PHYSICIAN:  Petra Kuba, M.D.                 DATE OF BIRTH:  [birth year]   DATE OF PROCEDURE:  07/07/2002  DATE OF DISCHARGE:                                 OPERATIVE REPORT   PROCEDURE:  Esophagogastroduodenoscopy with biopsy.   ENDOSCOPIST:  Petra Kuba, M.D.   INDICATIONS:  Anemia on aspirin and nonsteroidals.  Nondiagnostic  colonoscopy.  Consent was signed prior to any premedications given after the  risks, benefits, methods and options were thoroughly discussed in the past  and yesterday with the family.   PREMEDICATION:  Demerol 10 mg, Versed 1 mg.   DESCRIPTION OF PROCEDURE:  The video endoscope was inserted by direct  vision.  It showed a tortuous and slightly dilated esophagus and a very  large hiatal hernia.  The scope passed into the stomach and advanced into  the antrum; where some antral erosions and some shallow ulcers were seen and  advanced through a normal pylorus, into a normal duodenal bulb, and around  the celiac to a normal second portion of the duodenum.  Two scattered  duodenal biopsies were obtained to rule out any malabsorptions.   The scope was withdrawn back to the bulb and again a good look there ruled  out abnormalities and malrotations.  The scope was withdrawn back into the  stomach and retroflexed.  The angularis was normal.  High in the cardia, a  large hiatal hernia was confirmed.  Also on retroflexion some linear shallow  ulcers at the diaphragmatic hiatus were seen, compatible with Sheria Lang  lesions.   The scope was straightened and straight visualization of the stomach  confirmed the above-mentioned lesions and no other abnormalities.  We did  take a few biopsies of the antrum, of both the shallow erosions had ulcers;  as  well as a few biopsies of the gastritis and the [facility] lesions and put  them in the second container.  Air was suctioned and the scope was slowly  withdrawn; again, a good look at the hiatal hernia down to the esophagus  without any other significant findings.  The scope was removed.   The patient tolerated the procedure well.  There were no obvious immediate  complications.   ENDOSCOPIC DIAGNOSES:  1. Slightly tortuous and dilated esophagus.  2. Large hiatal hernia with linear, shallow ulcers at the diaphragmatic     hiatus.  Compatible with Sheria Lang lesions, status post biopsy.  3. Antral erosions and shallow ulcers, probably aspirin and nonsteroidal     induced; status post biopsy.  4. Otherwise, within normal limits to the second portion of the duodenum;     status post biopsy to rule out malabsorption.   PLAN:  1.  No aspirin or nonsteroidals.  2. Would use pump inhibitors.  3. Await pathology.  Treat H. pylori if positive.  4. Would start iron in a few days and slowly advance her diet.  5. Hopefully she can go home possibly in the a.m. if stable.  Will be happy     to see back p.r.n.  6. Would continue to monitor CBCs more closely on the iron and, if she needs     aspirin or nonsteroidals in the future, would continue to use long-term     pump inhibitors for maintenance and prevention.  Might even consider a     COX inhibitor at some point in the future.  Will watch her closely to see     if the pump inhibitor should be continued.                                               Petra Kuba, M.D.    MEM/MEDQ  D:  07/07/2002  T:  07/07/2002  Job:  [id number]   cc:   [name], M.D. [facility]   Genene Churn. Cyndie Chime, M.D.  [address] [facility]  [place]  [place] [address]  Fax: [phone]

## 2010-08-28 NOTE — H&P (Signed)
[name], [name] NO.:  [id number]   MEDICAL RECORD NO.:  [id number]          PATIENT TYPE:  INP   LOCATION:  6703                         FACILITY:  [facility]   PHYSICIAN:  Titus Dubin. Alwyn Ren, M.D. [facility] OF BIRTH:  01-30-1926   DATE OF ADMISSION:  04/14/2005  DATE OF DISCHARGE:                                HISTORY & PHYSICAL   HISTORY OF PRESENT ILLNESS:  [name] is a 77 year old white female  admitted with melena, abdominal pain and postural hypotension.   She describes several weeks of stool change with intermittent diarrhea.  She  says that she has seen blood as well as black tarry stool.  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  She also described diffuse abdominal pain, which prompted the  emergency room visit.  She could not qualitate or qualify it, but only  described it as a hurt-hurt.  In the emergency room, she was found to have  postural hypotension.  Her pulse rose from 106 lying to 120 standing.  She  was symptomatic with weakness.  No dysrhythmias were noted.   She had an upper endoscopy on April 07, 2005 which revealed a hiatal  hernia.  Remotely, in 2004, she had colonoscopy and endoscopy which revealed  hiatal hernia with small ulcerations.   PAST MEDICAL HISTORY:  Past medical history includes colon cancer.  She also  has had hypertension and osteoporosis.  The etiology of her blindness is  unknown.  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   FAMILY HISTORY:  Noncontributory.   ALLERGIES:  She is allergic to PENICILLIN.   HABITS:  She does not smoke or drink.   HOME MEDICINES:  Home medicines include Cosopt eye drops, iron  supplementation, Avalide, nifedipine, Lasix, calcitriol, Crestor, Darvocet,  Fosamax, diclofenac, misoprostol, Meclizine and aspirin.  The doses of the  medications were not provided.   REVIEW OF SYSTEMS:  Review of systems is  outlined above.  She denies any  antibiotics in the last 6 weeks.   She denies any chest pain or shortness of breath.   By history, she has had multiple renal cysts with some small complex cysts  on the left.   PHYSICAL EXAMINATION:  GENERAL:  She is in no acute distress at this time  and denies chest pain.  Skin is warm and dry.  VITAL SIGNS:  Temperature is 99, pulse 76, respiratory rate 18, blood  pressure 115/70.  HEENT:  The pupils are distorted; when attempts are made to examine the  eyes, they deviate laterally and superiorly without focus.  Dental hygiene  is fair to good.  Otic canals are clear.  Nares are clear.  LYMPHATICS:  She has no adenopathy about the head, neck or axillae.  NECK:  Thyroid is normal to palpation.  No carotid bruits are noted.  CARDIAC:  An S4 is present.  ABDOMEN:  Abdomen is slightly protuberant and doughy and nontender at this  time.  No organomegaly can be appreciated; no masses are noted.  EXTREMITIES:  She has trace pedal edema.  Pedal pulses are slightly  decreased.  There are mild stasis changes over the anterior shins.  Fingers  reveal mixed arthritic changes, predominantly degenerative joint disease.   LABORATORY DATA:  Her hemoglobin and hematocrit originally were 11.8 and  34.9, but have dropped to 10.6 and 30.2.  Urinalysis revealed many bacteria.  Potassium is 3.4.  Glucose 121, creatinine 1.3, BUN 34 and albumin 2.9.   ASSESSMENT AND PLAN:  She is admitted with abdominal pain which is now  quiescent and melena and intermittent diarrhea.  She has a history of colon  cancer and Gastroenterology has been notified by Dr. Mariel Aloe.  In fact,  Dr. Danise Edge was contacted, but deferred admission to Primary Care,  prompting my involvement.   She will be seen in consultation by Gastroenterology and prepped for  colonoscopy as per the plan transmitted by Dr. Stacie Acres.  Urine for culture and  sensitivity will be collected.  It is possible  that this is the etiology of  her abdominal pain.      Titus Dubin. Alwyn Ren, M.D. [facility]  Electronically Signed     [facility]  D:  04/14/2005  T:  04/14/2005  Job:  [id number]   cc:   [name], M.D. [facility]  [address]  [place]  [place] [address]   Petra Kuba, M.D.  Fax: [phone]   Genene Churn. Cyndie Chime, M.D.  Fax: [phone]

## 2010-09-07 IMAGING — CT CT CHEST W/O CM
2 of 5 series · 15 of 36 positions shown, 18 images · non-contrast
Comparison: Chest radiograph dated 07/02/2008

CLINICAL DATA: Evaluate left pleural effusion

CT CHEST WITHOUT CONTRAST
TECHNIQUE: Multidetector CT imaging of the chest was performed
following the standard protocol without IV contrast.

[Series 2: chest w/o st · axial · non-contrast · 0.55mm/px · z∈[-232,-27]mm · 12 of 49 slices shown, 15 images]
[im 4/49  mediastinal]
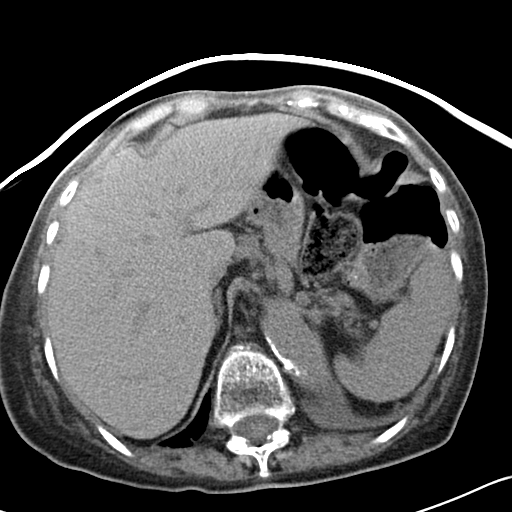
[im 4/49  lung]
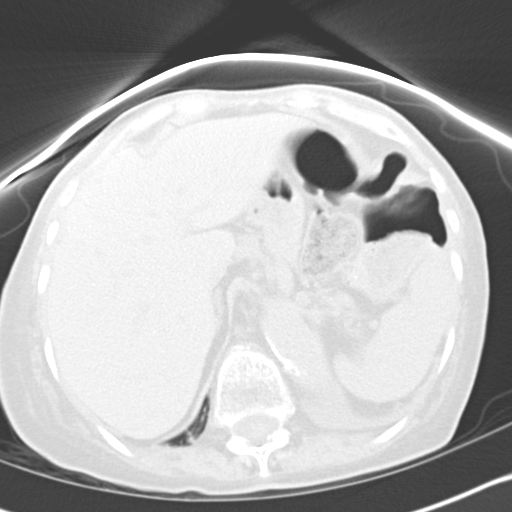
[im 8/49  lung]
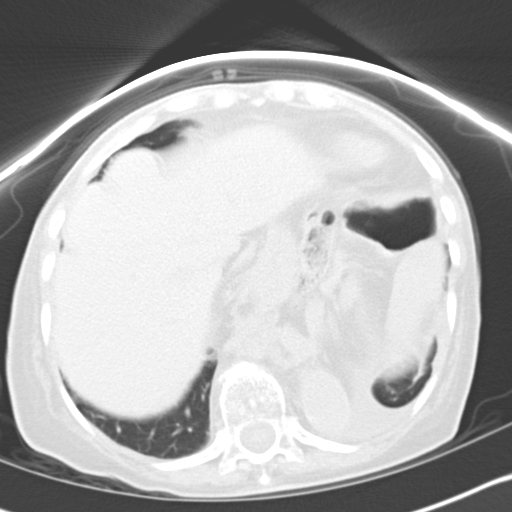
[im 12/49  lung]
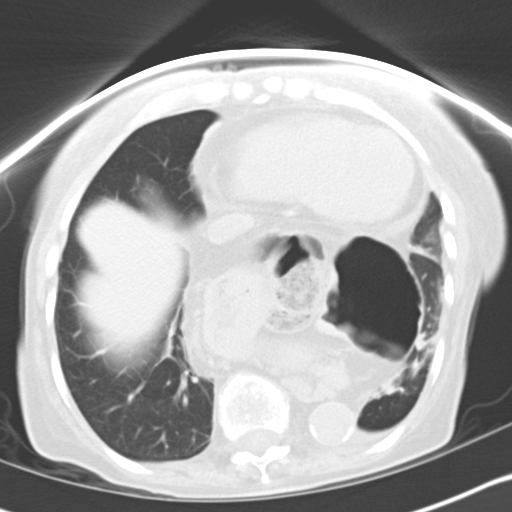
[im 15/49  lung]
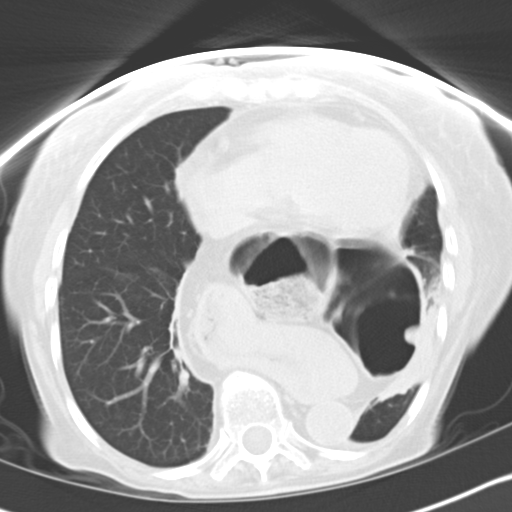
[im 19/49  mediastinal]
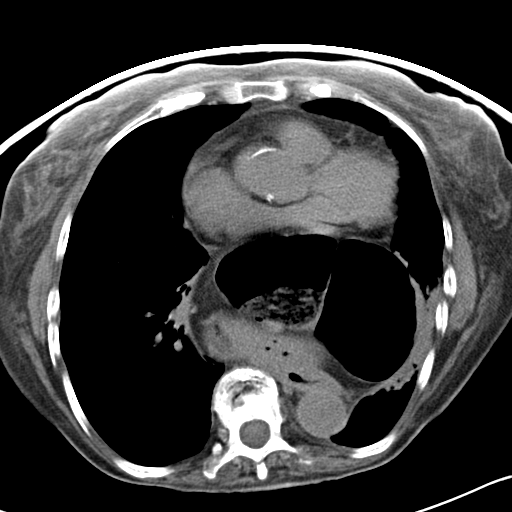
[im 19/49  lung]
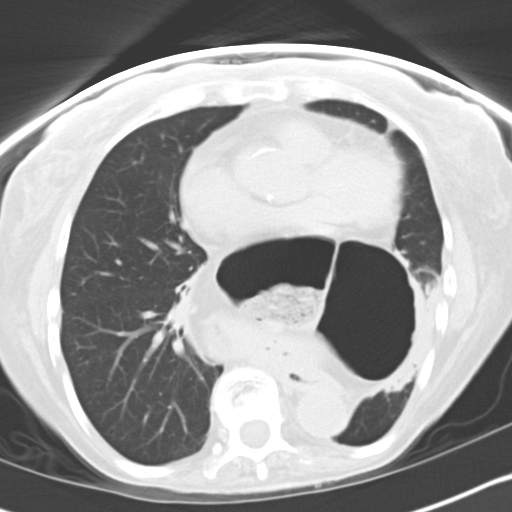
[im 23/49  lung]
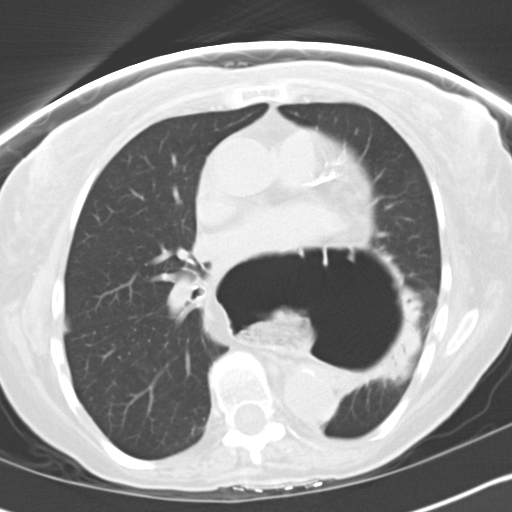
[im 26/49  lung]
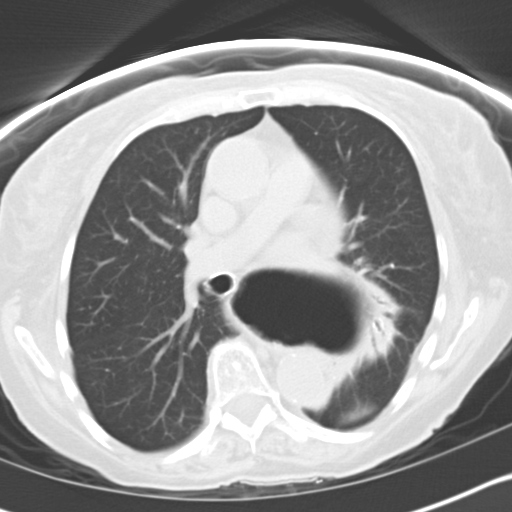
[im 30/49  lung]
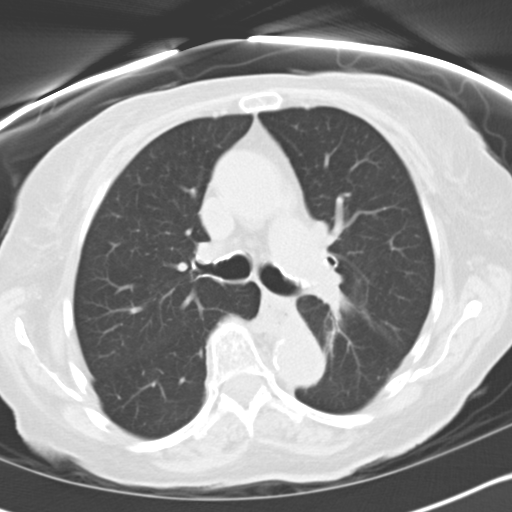
[im 34/49  mediastinal]
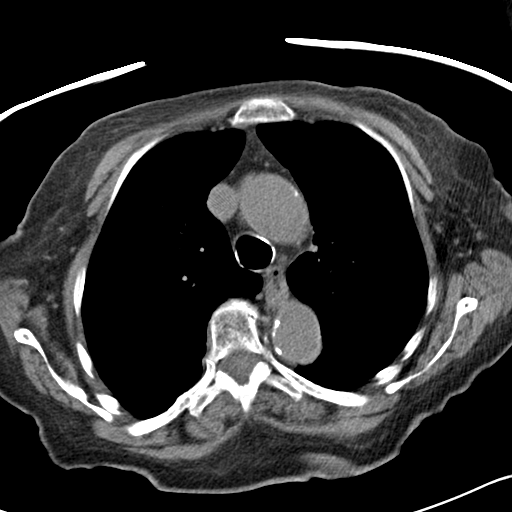
[im 34/49  lung]
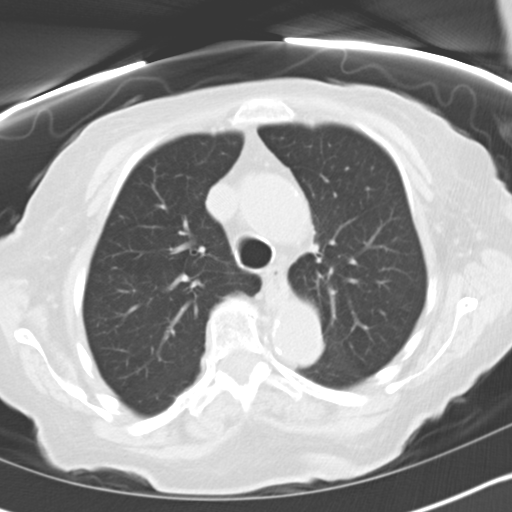
[im 37/49  lung]
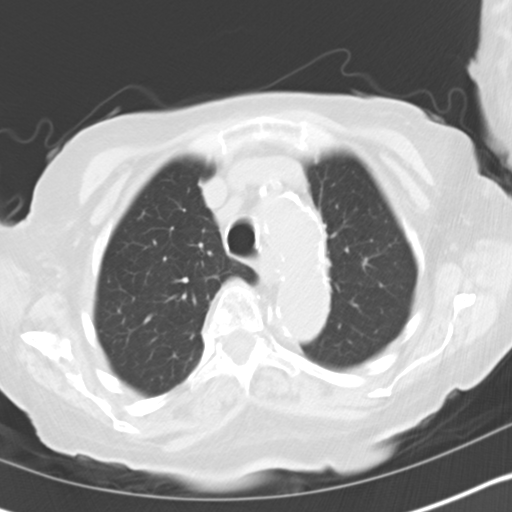
[im 41/49  lung]
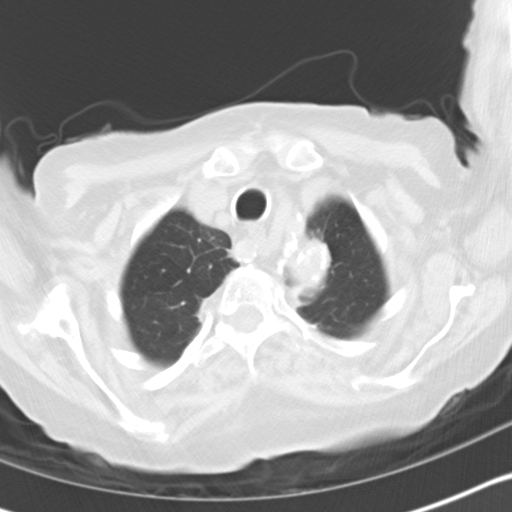
[im 45/49  lung]
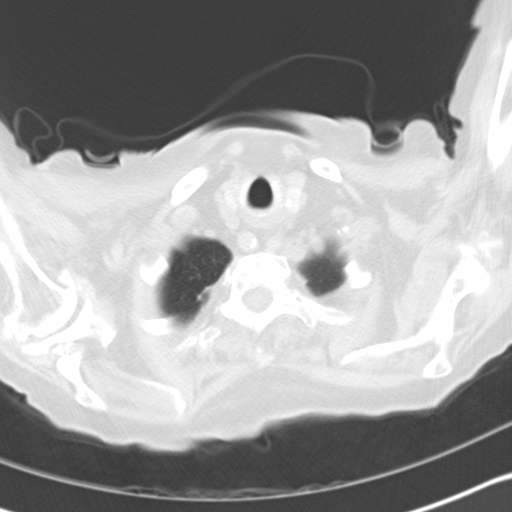

[Series 602: <mpr thick range> · coronal · 0.55mm/px · 3 of 72 slices shown]
[im 15/72  lung]
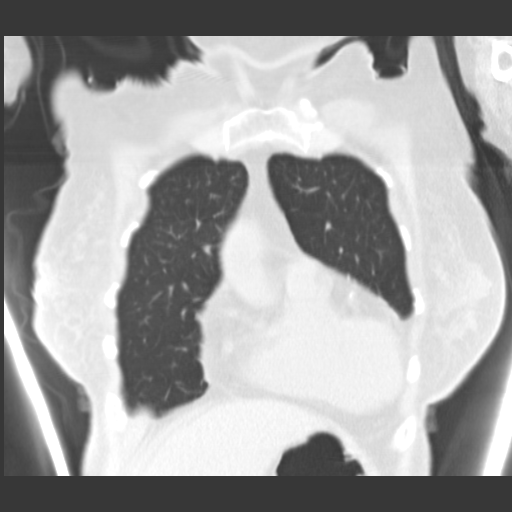
[im 29/72  lung]
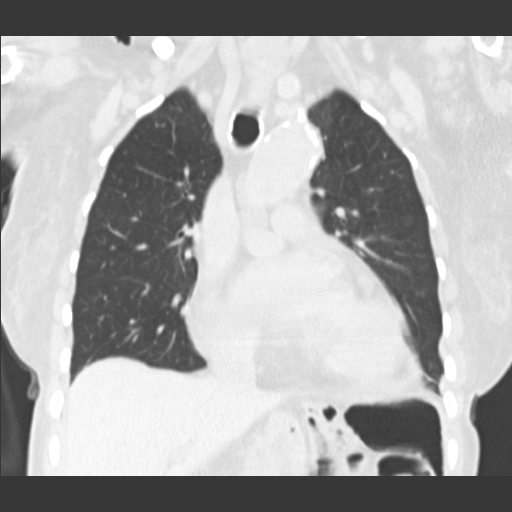
[im 43/72  lung]
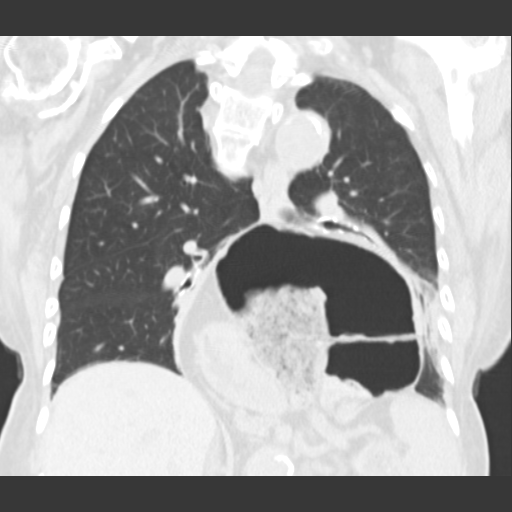

[15 of 36 positions shown; findings below may reference images not displayed]

FINDINGS: No enlarged axillary or supraclavicular lymph nodes.

There is no enlarged mediastinal or hilar lymph nodes.

A small left pleural effusion is noted.

The patient has a very large hiatal hernia.  There is adjacent
compressive type atelectasis involving the left lower lobe.

No pulmonary parenchymal nodule or mass identified.

Advanced degenerative changes affect both glenohumeral joints.

There is a compression fracture identified within the lower
thoracic spine.  This is not significantly changed when compared
with 08/08/2008.
IMPRESSION: 1.  Large hiatal hernia.
2.  Small left pleural effusion.
3.  Left lower lobe atelectasis which is likely compressive in
nature secondary to large hiatal hernia.
4.  No change in thoracic vertebral compression deformity

## 2013-12-12 ENCOUNTER — Telehealth: Payer: Self-pay

## 2013-12-12 NOTE — Telephone Encounter (Signed)
Patient died per Social Security Death Site °
